# Patient Record
Sex: Female | Born: 1990 | Race: Black or African American | Hispanic: No | Marital: Single | Smoking: Never smoker
Health system: Southern US, Community
[De-identification: ages and names within clinical notes are randomized; demographics above are authoritative.]

## PROBLEM LIST (undated history)

## (undated) DIAGNOSIS — D6800 Von Willebrand disease, unspecified: Secondary | ICD-10-CM

## (undated) DIAGNOSIS — R569 Unspecified convulsions: Secondary | ICD-10-CM

## (undated) HISTORY — PX: BREAST BIOPSY: SHX20

## (undated) HISTORY — PX: LEEP: SHX91

---
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
## 2011-02-20 ENCOUNTER — Inpatient Hospital Stay (INDEPENDENT_AMBULATORY_CARE_PROVIDER_SITE_OTHER)
Admission: RE | Admit: 2011-02-20 | Discharge: 2011-02-20 | Disposition: A | Discharge status: Home / Self Care | Payer: Medicaid Other | Source: Ambulatory Visit | Attending: Family Medicine | Admitting: Family Medicine

## 2011-02-20 DIAGNOSIS — J020 Streptococcal pharyngitis: Secondary | ICD-10-CM

## 2012-03-02 ENCOUNTER — Inpatient Hospital Stay: Payer: Self-pay

## 2012-03-02 LAB — URIC ACID: Uric Acid: 5.1 mg/dL (ref 2.6–6.0)

## 2012-03-02 LAB — BASIC METABOLIC PANEL
Anion Gap: 12 (ref 7–16)
BUN: 11 mg/dL (ref 7–18)
Chloride: 107 mmol/L (ref 98–107)
Co2: 22 mmol/L (ref 21–32)
Creatinine: 0.72 mg/dL (ref 0.60–1.30)
EGFR (African American): >60
EGFR (Non-African Amer.): >60
Glucose: 71 mg/dL (ref 65–99)
Osmolality: 279 (ref 275–301)
Potassium: 4.3 mmol/L (ref 3.5–5.1)
Sodium: 141 mmol/L (ref 136–145)

## 2012-03-02 LAB — DRUG SCREEN, URINE
Barbiturates, Ur Screen: NEGATIVE (ref ?–200)
Benzodiazepine, Ur Scrn: NEGATIVE (ref ?–200)
Methadone, Ur Screen: NEGATIVE (ref ?–300)
Tricyclic, Ur Screen: NEGATIVE (ref ?–1000)

## 2012-03-02 LAB — CBC
HCT: 28.1 % — ABNORMAL LOW (ref 35.0–47.0)
MCHC: 33.2 g/dL (ref 32.0–36.0)
Platelet: 235 10*3/uL (ref 150–440)
RBC: 3.22 10*6/uL — ABNORMAL LOW (ref 3.80–5.20)
RDW: 15.1 % — ABNORMAL HIGH (ref 11.5–14.5)
WBC: 15.4 10*3/uL — ABNORMAL HIGH (ref 3.6–11.0)

## 2012-03-02 LAB — PROTEIN / CREATININE RATIO, URINE: Protein, Random Urine: 14 mg/dL — ABNORMAL HIGH (ref 0–12)

## 2012-03-03 LAB — PATHOLOGY REPORT

## 2013-10-17 ENCOUNTER — Emergency Department: Payer: Self-pay | Admitting: Emergency Medicine

## 2013-10-17 LAB — RAPID INFLUENZA A&B ANTIGENS

## 2015-03-13 NOTE — H&P (Signed)
L&D Evaluation:  History:   HPI (Late entry after delivery)24 year old G1 P0 with no prenatal care and LMP in September presented to L&D fully dilated and the fetal head on the perineum. She reports ctxs starting around 0200 this Am and LOF around 0330.    Presents with abdominal pain, contractions    Patient's Medical History Von Willebrand's disease, Bipolar disorder, Migraines, fx left elbow    Patient's Surgical History Excision breast cyst in 10th grade and laparoscopy in 8th grade    Medications Lamictal 150 mgm daily at 0700 and Ritalin 10 mgm at 0700 and 1000 daily    Allergies NKDA    Family History Non-Contributory   ROS:   ROS see HPI   Exam:   Vital Signs initially BPs 146/96, and 153/73 with contraction pain,    Urine Protein negative dipstick, on cath urine postpartum    General in pain on arrival    Mental Status clear    Chest clear    Heart normal sinus rhythm, no murmur/gallop/rubs    Abdomen gravid, tender with contractions    Estimated Fetal Weight Average for gestational age, On Leopolds-EFW 6#    Fetal Position vtx    Edema trace pedal edema    Reflexes 2+  postpartum    Pelvic C/C/+3 on arrival    Mebranes Ruptured    Description ?MSAF in fetal hair    FHT 130s    Ucx regular, about q2-3    Skin dry   Impression:   Impression Start of second stage. No prenatal care. Hx of Von Willebrand's   Plan:   Plan IV access, Pitocin and cytotec ready for use after placenta delivered. Called Dr Patton SallesWeaver Lee for assistance in case of hemorrhage. Prenatal labs including PIH panel   Electronic Signatures: Trinna BalloonGutierrez, Delisia Mcquiston L (CNM)  (Signed 30-Apr-13 07:37)  Authored: L&D Evaluation   Last Updated: 30-Apr-13 07:37 by Trinna BalloonGutierrez, Tylisa Alcivar L (CNM)

## 2019-03-21 ENCOUNTER — Other Ambulatory Visit: Payer: Self-pay

## 2019-03-21 ENCOUNTER — Encounter: Payer: Self-pay | Admitting: Emergency Medicine

## 2019-03-21 ENCOUNTER — Emergency Department
Admission: EM | Admit: 2019-03-21 | Discharge: 2019-03-22 | Disposition: A | Discharge status: Other Acute Inpatient Hospital | Payer: 59 | Attending: Emergency Medicine | Admitting: Emergency Medicine

## 2019-03-21 DIAGNOSIS — R251 Tremor, unspecified: Secondary | ICD-10-CM | POA: Insufficient documentation

## 2019-03-21 DIAGNOSIS — Z1159 Encounter for screening for other viral diseases: Secondary | ICD-10-CM | POA: Diagnosis not present

## 2019-03-21 DIAGNOSIS — R569 Unspecified convulsions: Secondary | ICD-10-CM

## 2019-03-21 DIAGNOSIS — F339 Major depressive disorder, recurrent, unspecified: Secondary | ICD-10-CM

## 2019-03-21 DIAGNOSIS — F329 Major depressive disorder, single episode, unspecified: Secondary | ICD-10-CM | POA: Diagnosis not present

## 2019-03-21 DIAGNOSIS — Z046 Encounter for general psychiatric examination, requested by authority: Secondary | ICD-10-CM | POA: Insufficient documentation

## 2019-03-21 DIAGNOSIS — F419 Anxiety disorder, unspecified: Secondary | ICD-10-CM | POA: Insufficient documentation

## 2019-03-21 DIAGNOSIS — F445 Conversion disorder with seizures or convulsions: Secondary | ICD-10-CM | POA: Diagnosis present

## 2019-03-21 DIAGNOSIS — F32A Depression, unspecified: Secondary | ICD-10-CM

## 2019-03-21 DIAGNOSIS — F332 Major depressive disorder, recurrent severe without psychotic features: Secondary | ICD-10-CM | POA: Diagnosis present

## 2019-03-21 HISTORY — DX: Von Willebrand disease, unspecified: D68.00

## 2019-03-21 HISTORY — DX: Unspecified convulsions: R56.9

## 2019-03-21 HISTORY — DX: Von Willebrand's disease: D68.0

## 2019-03-21 LAB — URINE DRUG SCREEN, QUALITATIVE (ARMC ONLY)
Amphetamines, Ur Screen: NOT DETECTED
Barbiturates, Ur Screen: NOT DETECTED
Benzodiazepine, Ur Scrn: NOT DETECTED
Cannabinoid 50 Ng, Ur ~~LOC~~: NOT DETECTED
Cocaine Metabolite,Ur ~~LOC~~: NOT DETECTED
MDMA (Ecstasy)Ur Screen: NOT DETECTED
Methadone Scn, Ur: NOT DETECTED
Opiate, Ur Screen: NOT DETECTED
Phencyclidine (PCP) Ur S: NOT DETECTED
Tricyclic, Ur Screen: NOT DETECTED

## 2019-03-21 LAB — CBC WITH DIFFERENTIAL/PLATELET
Abs Immature Granulocytes: 0.02 10*3/uL (ref 0.00–0.07)
Basophils Absolute: 0 10*3/uL (ref 0.0–0.1)
Basophils Relative: 0 %
Eosinophils Absolute: 0.2 10*3/uL (ref 0.0–0.5)
Eosinophils Relative: 3 %
HCT: 36 % (ref 36.0–46.0)
Hemoglobin: 12.3 g/dL (ref 12.0–15.0)
Immature Granulocytes: 0 %
Lymphocytes Relative: 23 %
Lymphs Abs: 1.7 10*3/uL (ref 0.7–4.0)
MCH: 28.1 pg (ref 26.0–34.0)
MCHC: 34.2 g/dL (ref 30.0–36.0)
MCV: 82.2 fL (ref 80.0–100.0)
Monocytes Absolute: 0.5 10*3/uL (ref 0.1–1.0)
Monocytes Relative: 6 %
Neutro Abs: 4.8 10*3/uL (ref 1.7–7.7)
Neutrophils Relative %: 68 %
Platelets: 297 10*3/uL (ref 150–400)
RBC: 4.38 MIL/uL (ref 3.87–5.11)
RDW: 13.8 % (ref 11.5–15.5)
WBC: 7.2 10*3/uL (ref 4.0–10.5)
nRBC: 0 % (ref 0.0–0.2)

## 2019-03-21 LAB — URINALYSIS, COMPLETE (UACMP) WITH MICROSCOPIC
Bacteria, UA: NONE SEEN
Bilirubin Urine: NEGATIVE
Glucose, UA: NEGATIVE mg/dL
Hgb urine dipstick: NEGATIVE
Ketones, ur: 20 mg/dL — AB
Nitrite: NEGATIVE
Protein, ur: NEGATIVE mg/dL
Specific Gravity, Urine: 1.014 (ref 1.005–1.030)
pH: 5 (ref 5.0–8.0)

## 2019-03-21 LAB — COMPREHENSIVE METABOLIC PANEL
ALT: 32 U/L (ref 0–44)
AST: 30 U/L (ref 15–41)
Albumin: 4.4 g/dL (ref 3.5–5.0)
Alkaline Phosphatase: 78 U/L (ref 38–126)
Anion gap: 8 (ref 5–15)
BUN: 12 mg/dL (ref 6–20)
CO2: 24 mmol/L (ref 22–32)
Calcium: 8.9 mg/dL (ref 8.9–10.3)
Chloride: 105 mmol/L (ref 98–111)
Creatinine, Ser: 0.63 mg/dL (ref 0.44–1.00)
GFR calc Af Amer: >60 mL/min (ref >60)
GFR calc non Af Amer: >60 mL/min (ref >60)
Glucose, Bld: 93 mg/dL (ref 70–99)
Potassium: 3.9 mmol/L (ref 3.5–5.1)
Sodium: 137 mmol/L (ref 135–145)
Total Bilirubin: 0.6 mg/dL (ref 0.3–1.2)
Total Protein: 8.1 g/dL (ref 6.5–8.1)

## 2019-03-21 LAB — SALICYLATE LEVEL: Salicylate Lvl: <7 mg/dL (ref 2.8–30.0)

## 2019-03-21 LAB — ACETAMINOPHEN LEVEL: Acetaminophen (Tylenol), Serum: <10 ug/mL — ABNORMAL LOW (ref 10–30)

## 2019-03-21 LAB — ETHANOL: Alcohol, Ethyl (B): <10 mg/dL (ref <10)

## 2019-03-21 LAB — POCT PREGNANCY, URINE: Preg Test, Ur: NEGATIVE

## 2019-03-21 LAB — PREGNANCY, URINE: Preg Test, Ur: NEGATIVE

## 2019-03-21 NOTE — ED Notes (Signed)
VOL/ Consult will be ordered

## 2019-03-21 NOTE — ED Notes (Signed)
Hourly rounding reveals patient in room. No complaints, stable, in no acute distress. Q15 minute rounds and monitoring via Rover and Officer to continue.   
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
## 2019-03-21 NOTE — ED Notes (Signed)
Pt. Transferred from Triage to room 23 after dressing out and screening for contraband. Pt. Oriented to Quad including Q15 minute rounds as well as Rover and Officer for their protection. Patient is alert and oriented, warm and dry in no acute distress. Patient denies SI, HI, and AVH. Pt. Encouraged to let me know if needs arise.  

## 2019-03-21 NOTE — ED Notes (Signed)
Hourly rounding reveals patient sleeping in room. No complaints, stable, in no acute distress. Q15 minute rounds and monitoring via Rover and Officer to continue.  

## 2019-03-21 NOTE — ED Triage Notes (Signed)
Pt presents to ED with mom wanting a mental health evaluation. Has been feeling like her mom is being mean to her and they have been arguing. Seen by [facility] today. denies SI  Or HI. Pt has possible seizure disorder but mom reports "they may not be real". On medication for the same but it doesn't seem to be helping. Recently moved to this area from [place].

## 2019-03-22 ENCOUNTER — Inpatient Hospital Stay
Admission: AD | Admit: 2019-03-22 | Discharge: 2019-03-25 | DRG: 882 | Disposition: A | Discharge status: Home / Self Care | Payer: 59 | Source: Intra-hospital | Attending: Psychiatry | Admitting: Psychiatry

## 2019-03-22 ENCOUNTER — Encounter: Payer: Self-pay | Admitting: Psychiatry

## 2019-03-22 ENCOUNTER — Other Ambulatory Visit: Payer: Self-pay

## 2019-03-22 ENCOUNTER — Encounter: Payer: Self-pay | Admitting: *Deleted

## 2019-03-22 DIAGNOSIS — F332 Major depressive disorder, recurrent severe without psychotic features: Secondary | ICD-10-CM | POA: Diagnosis not present

## 2019-03-22 DIAGNOSIS — F32A Depression, unspecified: Secondary | ICD-10-CM | POA: Insufficient documentation

## 2019-03-22 DIAGNOSIS — R45851 Suicidal ideations: Secondary | ICD-10-CM | POA: Diagnosis present

## 2019-03-22 DIAGNOSIS — Z1159 Encounter for screening for other viral diseases: Secondary | ICD-10-CM | POA: Diagnosis not present

## 2019-03-22 DIAGNOSIS — F909 Attention-deficit hyperactivity disorder, unspecified type: Secondary | ICD-10-CM | POA: Diagnosis present

## 2019-03-22 DIAGNOSIS — F4310 Post-traumatic stress disorder, unspecified: Secondary | ICD-10-CM

## 2019-03-22 DIAGNOSIS — F445 Conversion disorder with seizures or convulsions: Secondary | ICD-10-CM | POA: Diagnosis present

## 2019-03-22 DIAGNOSIS — R569 Unspecified convulsions: Secondary | ICD-10-CM | POA: Diagnosis present

## 2019-03-22 DIAGNOSIS — F411 Generalized anxiety disorder: Secondary | ICD-10-CM | POA: Diagnosis present

## 2019-03-22 DIAGNOSIS — F329 Major depressive disorder, single episode, unspecified: Secondary | ICD-10-CM | POA: Insufficient documentation

## 2019-03-22 DIAGNOSIS — F339 Major depressive disorder, recurrent, unspecified: Secondary | ICD-10-CM

## 2019-03-22 LAB — SARS CORONAVIRUS 2 BY RT PCR (HOSPITAL ORDER, PERFORMED IN ~~LOC~~ HOSPITAL LAB): SARS Coronavirus 2: NEGATIVE

## 2019-03-22 MED ORDER — MAGNESIUM HYDROXIDE 400 MG/5ML PO SUSP: 30.0000 mL | Freq: Every day | ORAL | Status: DC | PRN

## 2019-03-22 MED ORDER — FLUOXETINE HCL 20 MG PO CAPS
20.0000 mg | ORAL_CAPSULE | Freq: Every day | ORAL | Status: DC
  Administered 2019-03-23 – 2019-03-25 (×3): 20 mg via ORAL
  Filled 2019-03-22 (×3): qty 1

## 2019-03-22 MED ORDER — FLUOXETINE HCL 20 MG PO CAPS
20.0000 mg | ORAL_CAPSULE | Freq: Every day | ORAL | Status: DC
  Filled 2019-03-22: qty 1

## 2019-03-22 MED ORDER — LORAZEPAM 1 MG PO TABS: 1.0000 mg | ORAL_TABLET | ORAL | Status: DC | PRN

## 2019-03-22 MED ORDER — ACETAMINOPHEN 325 MG PO TABS: 650.0000 mg | ORAL_TABLET | Freq: Four times a day (QID) | ORAL | Status: DC | PRN

## 2019-03-22 MED ORDER — DIPHENHYDRAMINE HCL 25 MG PO CAPS: 50.0000 mg | ORAL_CAPSULE | Freq: Every evening | ORAL | Status: DC | PRN

## 2019-03-22 MED ORDER — RISPERIDONE 1 MG PO TBDP
2.0000 mg | ORAL_TABLET | Freq: Three times a day (TID) | ORAL | Status: DC | PRN
  Filled 2019-03-22: qty 2

## 2019-03-22 MED ORDER — LORAZEPAM 0.5 MG PO TABS: 0.5000 mg | ORAL_TABLET | ORAL | Status: DC

## 2019-03-22 MED ORDER — HYDROXYZINE HCL 25 MG PO TABS: 25.0000 mg | ORAL_TABLET | Freq: Three times a day (TID) | ORAL | Status: DC | PRN

## 2019-03-22 MED ORDER — NON FORMULARY: 1000.0000 mg | Freq: Two times a day (BID) | Status: DC

## 2019-03-22 MED ORDER — ALUM & MAG HYDROXIDE-SIMETH 200-200-20 MG/5ML PO SUSP: 30.0000 mL | ORAL | Status: DC | PRN

## 2019-03-22 MED ORDER — LEVETIRACETAM 500 MG PO TABS: 1000.0000 mg | ORAL_TABLET | Freq: Two times a day (BID) | ORAL | Status: DC

## 2019-03-22 MED ORDER — LEVETIRACETAM 500 MG PO TABS
1000.0000 mg | ORAL_TABLET | Freq: Two times a day (BID) | ORAL | Status: DC
  Administered 2019-03-22 – 2019-03-25 (×6): 1000 mg via ORAL
  Filled 2019-03-22 (×6): qty 2

## 2019-03-22 MED ORDER — ZIPRASIDONE MESYLATE 20 MG IM SOLR: 20.0000 mg | INTRAMUSCULAR | Status: DC | PRN

## 2019-03-22 NOTE — ED Notes (Signed)
BEHAVIORAL HEALTH ROUNDING Patient sleeping: No. Patient alert and oriented: yes Behavior appropriate: Yes.  ; If no, describe:  Nutrition and fluids offered: yes Toileting and hygiene offered: Yes  Sitter present: q15 minute observations and security  monitoring Law enforcement present: Yes  ODS  

## 2019-03-22 NOTE — ED Notes (Signed)
ED  Is the patient under IVC or is there intent for IVC: Yes.   Is the patient medically cleared: Yes.   Is there vacancy in the ED BHU: Yes.   Unit closed   Is the population mix appropriate for patient: Yes.   Is the patient awaiting placement in inpatient or outpatient setting:  Has the patient had a psychiatric consult: Yes.  Seen by NP - pt awaiting reassessment and colateral info  Survey of unit performed for contraband, proper placement and condition of furniture, tampering with fixtures in bathroom, shower, and each patient room: Yes.  ; Findings:  APPEARANCE/BEHAVIOR Calm and cooperative NEURO ASSESSMENT Orientation: oriented x3  Denies pain Hallucinations: No.None noted (Hallucinations) denies  Speech: Normal Gait: normal RESPIRATORY ASSESSMENT Even  Unlabored respirations  CARDIOVASCULAR ASSESSMENT Pulses equal   regular rate  Skin warm and dry   GASTROINTESTINAL ASSESSMENT no GI complaint EXTREMITIES Full ROM  PLAN OF CARE Provide calm/safe environment. Vital signs assessed twice daily. ED BHU Assessment once each 12-hour shift. Collaborate with TTS if available or as condition indicates. Assure the ED provider has rounded once each shift. Provide and encourage hygiene. Provide redirection as needed. Assess for escalating behavior; address immediately and inform ED provider.  Assess family dynamic and appropriateness for visitation as needed: Yes.  ; If necessary, describe findings:  Educate the patient/family about BHU procedures/visitation: Yes.  ; If necessary, describe findings:

## 2019-03-22 NOTE — ED Notes (Signed)

## 2019-03-22 NOTE — Tx Team (Signed)
Initial Treatment Plan 03/22/2019 4:13 PM Celica E Trigo VEL:[id number]    PATIENT STRESSORS: Financial difficulties Marital or family conflict Occupational concerns   PATIENT STRENGTHS: Average or above average intelligence Communication skills Physical Health   PATIENT IDENTIFIED PROBLEMS: Family conflict  Depression  Anxiety  Ineffective coping skills  "I need to work on my anger."             DISCHARGE CRITERIA:  Improved stabilization in mood, thinking, and/or behavior Need for constant or close observation no longer present Verbal commitment to aftercare and medication compliance  PRELIMINARY DISCHARGE PLAN: Outpatient therapy Return to previous living arrangement  PATIENT/FAMILY INVOLVEMENT: This treatment plan has been presented to and reviewed with the patient, Priyana E Courtney.  The patient and family have been given the opportunity to ask questions and make suggestions.  Cranford Mon, RN 03/22/2019, 4:13 PM

## 2019-03-22 NOTE — ED Notes (Signed)
Hourly rounding reveals patient sleeping in room. No complaints, stable, in no acute distress. Q15 minute rounds and monitoring via Rover and Officer to continue.  

## 2019-03-22 NOTE — ED Notes (Signed)
Pt. C/o "having a seizure. Presents with hands shaking with ability to verbally discuss symptoms. Dr. Fanny Bien notified.

## 2019-03-22 NOTE — ED Notes (Signed)
Gave food tray with juice. 

## 2019-03-22 NOTE — ED Notes (Signed)
Pt observed lying in bed - watching TV   Pt visualized with NAD  No verbalized needs or concerns at this time   Assessment completed  Plan of care discussed   Continue to monitor
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
## 2019-03-22 NOTE — BH Assessment (Signed)
Patient is to be admitted to [facility]U by Dr. Viviano Simas, pending COIVD-19  Attending Physician will be Dr. Toni Amend.   Patient has been assigned to room 302 by [facility] Charge Nurse [place].   Intake Paper Work has been signed and placed on patient chart.  ER staff is aware of the admission:  Misty Stanley, ER Secretary    Dr. Scotty Court, ER MD   Amy T., Patient's Nurse   Donella Stade., Patient Access.
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
## 2019-03-22 NOTE — ED Provider Notes (Addendum)
-----------------------------------------   1:46 AM on 03/22/2019 -----------------------------------------   Blood pressure 111/72, pulse (!) 107, temperature 98.5 F (36.9 C), temperature source Oral, resp. rate 18, height 5\' 7"  (1.702 m), weight 77.1 kg, SpO2 96 %.  The patient is calm and cooperative at this time.  There have been no acute events since the last update.  Awaiting disposition plan from Behavioral Medicine team.    Sharyn Creamer, MD 03/22/19 0146   ----------------------------------------- 7:04 AM on 03/22/2019 -----------------------------------------  Patient very anxious, she reports she starts to shake she gets very anxious.  She is fully conversant and alert.  She reports that she is just really nervous about everything is been going on her life.  If she gets symptoms like this when she is nervous, will give a small dose of oral Ativan as she appears very anxious.  She is tremulous, but definitely not having seizure-like activity.  She reports same in the past multiple times      Sharyn Creamer, MD 03/22/19 [phone]
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
## 2019-03-22 NOTE — ED Notes (Signed)
VOL/ Consult completed by NP request re-evaluation in Am

## 2019-03-23 DIAGNOSIS — F4310 Post-traumatic stress disorder, unspecified: Principal | ICD-10-CM

## 2019-03-23 DIAGNOSIS — F909 Attention-deficit hyperactivity disorder, unspecified type: Secondary | ICD-10-CM

## 2019-03-23 LAB — LEVETIRACETAM LEVEL: Levetiracetam Lvl: 15.4 ug/mL (ref 10.0–40.0)

## 2019-03-23 MED ORDER — PRAZOSIN HCL 1 MG PO CAPS
1.0000 mg | ORAL_CAPSULE | Freq: Every day | ORAL | Status: DC
  Administered 2019-03-23 – 2019-03-24 (×2): 1 mg via ORAL
  Filled 2019-03-23 (×2): qty 1

## 2019-03-23 MED ORDER — METHYLPHENIDATE HCL ER (OSM) 18 MG PO TBCR: 18.0000 mg | EXTENDED_RELEASE_TABLET | Freq: Every day | ORAL | Status: DC

## 2019-03-23 MED ORDER — AMPHETAMINE-DEXTROAMPHETAMINE 5 MG PO TABS
10.0000 mg | ORAL_TABLET | Freq: Two times a day (BID) | ORAL | Status: DC
  Administered 2019-03-23 – 2019-03-25 (×4): 10 mg via ORAL
  Filled 2019-03-23: qty 2
  Filled 2019-03-23: qty 1
  Filled 2019-03-23 (×3): qty 2

## 2019-03-23 NOTE — [facility] Suicide Risk Assessment (Signed)
[facility]   Nursing information obtained from:  Patient Demographic factors:  Low socioeconomic status, Unemployed Current Mental Status:  NA Loss Factors:  Financial problems / change in socioeconomic status Historical Factors:  NA Risk Reduction Factors:  Sense of responsibility to family  Total Time spent with patient: 1 hour Principal Problem: <principal problem not specified> Diagnosis:  Active Problems:   MDD (major depressive disorder), recurrent severe, without psychosis (HCC)  Subjective Data: Patient seen and chart reviewed.  See intake note.  Young woman with a history of mood instability and impulsivity has been feeling overwhelmed and stressed by living with her mother.  Reports passive suicidal thoughts but no intention or plan of acting on them.  Does not report any psychotic symptoms does not report any violent thoughts.  She is open and appropriately interested in receiving treatment.  Not abusing substances.  Continued Clinical Symptoms:  Alcohol Use Disorder Identification Test Final Score (AUDIT): 0 The "Alcohol Use Disorders Identification Test", Guidelines for Use in Primary Care, Second Edition.  World Science writerHealth Organization [facility](WHO). Score between 0-7:  no or low risk or alcohol related problems. Score between 8-15:  moderate risk of alcohol related problems. Score between 16-19:  high risk of alcohol related problems. Score 20 or above:  warrants further diagnostic evaluation for alcohol dependence and treatment.   CLINICAL FACTORS:   Depression:   Impulsivity   Musculoskeletal: Strength & Muscle Tone: within normal limits Gait & Station: normal Patient leans: N/A  Psychiatric Specialty Exam: Physical Exam  Nursing note and vitals reviewed. Constitutional: She appears well-developed and well-nourished.  HENT:  Head: Normocephalic and atraumatic.  Eyes: Pupils are equal, round, and reactive to light. Conjunctivae are normal.   Neck: Normal range of motion.  Cardiovascular: Regular rhythm and normal heart sounds.  Respiratory: Effort normal. No respiratory distress.  GI: Soft.  Musculoskeletal: Normal range of motion.  Neurological: She is alert.  Skin: Skin is warm and dry.  Psychiatric: She has a normal mood and affect. Her speech is normal and behavior is normal. Judgment and thought content normal. Cognition and memory are normal.    Review of Systems  Constitutional: Negative.   HENT: Negative.   Eyes: Negative.   Respiratory: Negative.   Cardiovascular: Negative.   Gastrointestinal: Negative.   Musculoskeletal: Negative.   Skin: Negative.   Neurological: Negative.   Psychiatric/Behavioral: Positive for suicidal ideas. The patient is nervous/anxious.     Blood pressure 104/61, pulse 87, temperature 97.6 F (36.4 C), temperature source Oral, resp. rate 18, height 5\' 7"  (1.702 m), weight 77.1 kg, SpO2 100 %.Body mass index is 26.62 kg/m.  General Appearance: Casual  Eye Contact:  Good  Speech:  Clear and Coherent  Volume:  Normal  Mood:  Anxious  Affect:  Appropriate  Thought Process:  Goal Directed  Orientation:  Full (Time, Place, and Person)  Thought Content:  Logical  Suicidal Thoughts:  No  Homicidal Thoughts:  No  Memory:  Immediate;   Fair Recent;   Fair Remote;   Fair  Judgement:  Fair  Insight:  Fair  Psychomotor Activity:  Normal  Concentration:  Concentration: Fair  Recall:  [employer]  Fund of Knowledge:  Fair  Language:  Fair  Akathisia:  No  Handed:  Right  AIMS (if indicated):     Assets:  Desire for Improvement Housing Physical Health Resilience  ADL's:  Intact  Cognition:  WNL  Sleep:  Number of Hours: 5.15  COGNITIVE FEATURES THAT CONTRIBUTE TO RISK:  Polarized thinking    SUICIDE RISK:   Minimal: No identifiable suicidal ideation.  Patients presenting with no risk factors but with morbid ruminations; may be classified as minimal risk based on the severity  of the depressive symptoms  PLAN OF CARE: Patient admitted to the psychiatric ward.  15-minute checks in place.  Spoke with patient about making some changes to medication to try and address her symptoms.  Include patient in individual and group therapy.  Make sure she has appropriate outpatient treatment when she leaves.  Ongoing reassessment of suicidality.  I certify that inpatient services furnished can reasonably be expected to improve the patient's condition.   Mordecai Rasmussen, MD 03/23/2019, 1:34 PM
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
## 2019-03-23 NOTE — [facility] Group Notes (Signed)
LCSW Group Therapy Note  03/23/2019 1:00 PM  Type of Therapy/Topic:  Group Therapy:  Emotion Regulation  Participation Level:  Did Not Attend   Description of Group:   The purpose of this group is to assist patients in learning to regulate negative emotions and experience positive emotions. Patients will be guided to discuss ways in which they have been vulnerable to their negative emotions. These vulnerabilities will be juxtaposed with experiences of positive emotions or situations, and patients will be challenged to use positive emotions to combat negative ones. Special emphasis will be placed on coping with negative emotions in conflict situations, and patients will process healthy conflict resolution skills.  Therapeutic Goals: 1. Patient will identify two positive emotions or experiences to reflect on in order to balance out negative emotions 2. Patient will label two or more emotions that they find the most difficult to experience 3. Patient will demonstrate positive conflict resolution skills through discussion and/or role plays  Summary of Patient Progress: X  Therapeutic Modalities:   Cognitive Behavioral Therapy Feelings Identification Dialectical Behavioral Therapy  Penni Homans, MSW, LCSW 03/23/2019 12:40 PM
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
## 2019-03-23 NOTE — Tx Team (Addendum)
Interdisciplinary Treatment and Diagnostic Plan Update  03/23/2019 Time of Session: 2:30pm Sandra Chavez MRN: [id number]  Principal Diagnosis: PTSD (post-traumatic stress disorder)  Secondary Diagnoses: Principal Problem:   PTSD (post-traumatic stress disorder) Active Problems:   Seizures (HCC)   Attention deficit hyperactivity disorder (ADHD)   Current Medications:  Current Facility-Administered Medications  Medication Dose Route Frequency Provider Last Rate Last Dose  . acetaminophen (TYLENOL) tablet 650 mg  650 mg Oral Q6H PRN Mariel Craft, MD      . alum & mag hydroxide-simeth (MAALOX/MYLANTA) 200-200-20 MG/5ML suspension 30 mL  30 mL Oral Q4H PRN Mariel Craft, MD      . diphenhydrAMINE (BENADRYL) capsule 50 mg  50 mg Oral QHS PRN Mariel Craft, MD      . FLUoxetine (PROZAC) capsule 20 mg  20 mg Oral Daily Mariel Craft, MD   20 mg at 03/23/19 0754  . hydrOXYzine (ATARAX/VISTARIL) tablet 25 mg  25 mg Oral TID PRN Mariel Craft, MD      . levETIRAcetam (KEPPRA) tablet 1,000 mg  1,000 mg Oral BID Clapacs, Jackquline Denmark, MD   1,000 mg at 03/23/19 0907  . magnesium hydroxide (MILK OF MAGNESIA) suspension 30 mL  30 mL Oral Daily PRN Mariel Craft, MD      . methylphenidate (CONCERTA) CR tablet 18 mg  18 mg Oral Daily Clapacs, John T, MD      . prazosin (MINIPRESS) capsule 1 mg  1 mg Oral QHS Clapacs, John T, MD       PTA Medications: Medications Prior to Admission  Medication Sig Dispense Refill Last Dose  . Biotin 1000 MCG tablet Take 1,000 mcg by mouth daily.   03/21/2019 at 0730  . Cholecalciferol (VITAMIN D3) 125 MCG (5000 UT) TABS Take 5,000 Units by mouth daily.   03/21/2019 at 0730  . ferrous sulfate 325 (65 FE) MG tablet Take 325 mg by mouth daily.   03/21/2019 at 0730  . folic acid (FOLVITE) 800 MCG tablet Take 400 mcg by mouth daily.   03/21/2019 at 0730  . levETIRAcetam (KEPPRA) 500 MG tablet Take 1,000 mg by mouth 2 (two) times daily.   03/21/2019 at 0730  .  Pyridoxine HCl (VITAMIN B-6) 250 MG tablet Take 250 mg by mouth daily.   03/21/2019 at 0730    Patient Stressors: Financial difficulties Marital or family conflict Occupational concerns  Patient Strengths: Average or above average intelligence Communication skills Physical Health  Treatment Modalities: Medication Management, Group therapy, Case management,  1 to 1 session with clinician, Psychoeducation, Recreational therapy.   Physician Treatment Plan for Primary Diagnosis: PTSD (post-traumatic stress disorder) Long Term Goal(s): Improvement in symptoms so as ready for discharge Improvement in symptoms so as ready for discharge   Short Term Goals: Ability to verbalize feelings will improve Ability to demonstrate self-control will improve Ability to maintain clinical measurements within normal limits will improve Compliance with prescribed medications will improve  Medication Management: Evaluate patient's response, side effects, and tolerance of medication regimen.  Therapeutic Interventions: 1 to 1 sessions, Unit Group sessions and Medication administration.  Evaluation of Outcomes: Progressing  Physician Treatment Plan for Secondary Diagnosis: Principal Problem:   PTSD (post-traumatic stress disorder) Active Problems:   Seizures (HCC)   Attention deficit hyperactivity disorder (ADHD)  Long Term Goal(s): Improvement in symptoms so as ready for discharge Improvement in symptoms so as ready for discharge   Short Term Goals: Ability to verbalize feelings will improve Ability to  demonstrate self-control will improve Ability to maintain clinical measurements within normal limits will improve Compliance with prescribed medications will improve     Medication Management: Evaluate patient's response, side effects, and tolerance of medication regimen.  Therapeutic Interventions: 1 to 1 sessions, Unit Group sessions and Medication administration.  Evaluation of Outcomes:  Progressing   RN Treatment Plan for Primary Diagnosis: PTSD (post-traumatic stress disorder) Long Term Goal(s): Knowledge of disease and therapeutic regimen to maintain health will improve  Short Term Goals: Ability to demonstrate self-control, Ability to verbalize feelings will improve, Ability to disclose and discuss suicidal ideas and Ability to identify and develop effective coping behaviors will improve  Medication Management: RN will administer medications as ordered by provider, will assess and evaluate patient's response and provide education to patient for prescribed medication. RN will report any adverse and/or side effects to prescribing provider.  Therapeutic Interventions: 1 on 1 counseling sessions, Psychoeducation, Medication administration, Evaluate responses to treatment, Monitor vital signs and CBGs as ordered, Perform/monitor CIWA, COWS, AIMS and Fall Risk screenings as ordered, Perform wound care treatments as ordered.  Evaluation of Outcomes: Progressing   LCSW Treatment Plan for Primary Diagnosis: PTSD (post-traumatic stress disorder) Long Term Goal(s): Safe transition to appropriate next level of care at discharge, Engage patient in therapeutic group addressing interpersonal concerns.  Short Term Goals: Engage patient in aftercare planning with referrals and resources, Increase social support, Increase ability to appropriately verbalize feelings, Increase emotional regulation and Facilitate acceptance of mental health diagnosis and concerns  Therapeutic Interventions: Assess for all discharge needs, 1 to 1 time with Social worker, Explore available resources and support systems, Assess for adequacy in community support network, Educate family and significant other(s) on suicide prevention, Complete Psychosocial Assessment, Interpersonal group therapy.  Evaluation of Outcomes: Progressing   Progress in Treatment: Attending groups: No. Participating in groups:  No. Taking medication as prescribed: Yes. Toleration medication: Yes. Family/Significant other contact made: Yes, individual(s) contacted:  SPE completed with pt's mother. Patient understands diagnosis: Yes. Discussing patient identified problems/goals with staff: Yes. Medical problems stabilized or resolved: Yes. Denies suicidal/homicidal ideation: Yes. Issues/concerns per patient self-inventory: No. Other: none  New problem(s) identified: No, Describe:  none  New Short Term/Long Term Goal(s): medication management for mood stabilization; elimination of SI thoughts; development of comprehensive mental wellness plan.  Patient Goals:  "just want to have medication to keep me calm so I don't end up here again"  Discharge Plan or Barriers: Patient reports plans to discharge home to her mothers.  Mother agrees, however, pt will need to look for her own permanent housing.  Pt has an outpatient appointment set up.   Reason for Continuation of Hospitalization: Anxiety Depression Medication stabilization Suicidal ideation  Estimated Length of Stay: 1-5 days  Recreational Therapy: Patient Stressors: Family, Work  Patient Goal: Patient will identify 3 positive coping skills strategies to use for anxiety post d/c within 5 recreation therapy group sessions  Attendees: Patient: Sandra Chavez 03/23/2019 3:21 PM  Physician: Dr. Toni Amendlapacs, MD 03/23/2019 3:21 PM  Nursing: Hulan AmatoGwen Farrish, RN 03/23/2019 3:21 PM  RN Care Manager: 03/23/2019 3:21 PM  Social Worker: Penni HomansMichaela Stanfield, LCSW 03/23/2019 3:21 PM  Recreational Therapist: Garret ReddishShay Shantaya Bluestone, Drue FlirtRS, LRT 03/23/2019 3:21 PM  Other: Lowella Dandyarren Livingston, LCSW 03/23/2019 3:21 PM  Other:  03/23/2019 3:21 PM  Other: 03/23/2019 3:21 PM    Scribe for Treatment Team: Harden MoMichaela J Stanfield, LCSW 03/23/2019 3:21 PM

## 2019-03-23 NOTE — Progress Notes (Signed)
D: Patient stated slept fair last night .Stated appetite  good and energy level   normal. Stated concentration  good . Stated on Depression scale 6, hopeless 0 and anxiety 4 .( low 0-10 high) Denies suicidal  homicidal ideations  .  No auditory hallucinations  No pain concerns . Appropriate ADL'S. Interacting with peers and staff. Instructed on [facility] Education  and unit programing . Verbalizing understanding .Patient able to  attend unit programing . Able to verbalize   and understanding of information  received . Working on anxiety , coping and decision making skills .  Compliant wit medication , understanding of medication given . Thought process improving . Voice non concerns around  wake and sleep cycle.   Emotional and mental status  improving . No anger outburst  or control issues . Patient voice of no safety  concerns . Ey contact improving  during conversations . Patient able to say positive  attributes  about self . Patient voice  Of her goal today  Work on anxiety and coping skills    A: Encourage patient participation with unit programming . Instruction  Given on  Medication , verbalize understanding.  R: Voice no other concerns. Staff continue to monitor

## 2019-03-23 NOTE — Progress Notes (Signed)
Patient is calm and oriented, adjusting to  Unit guide lines and safety, appetite is good, interact with staff and behavior is appropriate , takes her medications with any issues patient denies any SI/HI/AVH and has no physical complains. sleep is continuous with out any interruptions , only requiring 15 minutes safety checks no distress.Marland Kitchen

## 2019-03-23 NOTE — BHH Counselor (Signed)
Mother reports that the patient was previously living in [place] before returning to the home May 9th. Mother reports that patient did not live with her in [place] "due to her child-like behaviors, she would throw tantrums and be irate. 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 Mother also reports that the patient "became upset that she made her son a meal and he didn't want it.  He knows that she is his mother but he doesn't see her as that and thinks of her more like a sister".  Mother reports that she has had guardianship of the pt's 29 year old "ever since he was born".  Mother reports "since she was mad with me she would not have a relationship with him."  Mother reports "she gets mad with [place] when we tell her what she is thinking is not true or wrong."  Mother reports "any time that I correct her she is screaming and mad".   Mother reports that "she began to act out so I called [facility].  She told the lady that she was suicidal so I had to bring her in to their office."  Mother reports that she began to have a seizure "as soon as she got in the car. But it's hard for me to conceive that she will go from having a seizure to being completely lucid."  Mother reports that her father took the patient to the hospital at the patient's insistence.  Mother reports that the patient was acting child-like at this time.  Mother reports that the patient "sang the entire way to the hospital".   Mother reports a history of Bipolar Disorder, medication and outpatient therapy.  Mother reports that the seizures appear to be directly to anxiety and mental health.   Penni Homans, MSW, LCSW 03/23/2019 11:21 AM

## 2019-03-23 NOTE — Progress Notes (Signed)
Recreation Therapy Notes  INPATIENT RECREATION THERAPY ASSESSMENT  Patient Details Name: Sandra Chavez MRN: [id number] DOB: 05/01/1991 Today's Date: 03/23/2019       Information Obtained From: Patient  Able to Participate in Assessment/Interview: Yes  Patient Presentation: Responsive, Alert, Oriented  Reason for Admission (Per Patient): Active Symptoms  Patient Stressors: Family, Work  Pharmacologist:   Music, Talk, Other (Comment)(video games)  Leisure Interests (2+):  Exercise - Walking, Individual - Reading, Games - Video games  Frequency of Recreation/Participation: Weekly  Awareness of Community Resources:  Yes  Community Resources:  Other (Comment)([facility])  Current Use:    If no, Barriers?:    Expressed Interest in [employer] Information:    [place] of Residence:  Film/video editor  Patient Main Form of Transportation: Other (Comment)(Family)  Patient Strengths:  Kind, compassionate, help others  Patient Identified Areas of Improvement:  Being more rational   Patient Goal for Hospitalization:  Try and manage my stress and anxiety better.  Current SI (including self-harm):  No  Current HI:  No  Current AVH: No  Staff Intervention Plan: Group Attendance, Collaborate with Interdisciplinary Treatment Team  Consent to Intern Participation: N/A  Sandra Chavez 03/23/2019, 11:09 AM

## 2019-03-23 NOTE — Plan of Care (Signed)
Instructed on [facility] Education  and unit programing . Verbalizing understanding .Patient able to  attend unit programing . Able to verbalize   and understanding of information  received . Working on anxiety , coping and decision making skills .  Compliant wit medication , understanding of medication given . Thought process improving . Voice non concerns around  wake and sleep cycle.   Emotional and mental status  improving . No anger outburst  or control issues . Patient voice of no safety  concerns . Ey contact improving  during conversations . Patient able to say positive  attributes  about self .  Problem: Education: Goal: Ability to state activities that reduce stress will improve Outcome: Progressing   Problem: Coping: Goal: Ability to identify and develop effective coping behavior will improve Outcome: Progressing   Problem: Self-Concept: Goal: Ability to identify factors that promote anxiety will improve Outcome: Progressing Goal: Level of anxiety will decrease Outcome: Progressing Goal: Ability to modify response to factors that promote anxiety will improve Outcome: Progressing   Problem: Education: Goal: Utilization of techniques to improve thought processes will improve Outcome: Progressing Goal: Knowledge of the prescribed therapeutic regimen will improve Outcome: Progressing   Problem: Activity: Goal: Interest or engagement in leisure activities will improve Outcome: Progressing Goal: Imbalance in normal sleep/wake cycle will improve Outcome: Progressing   Problem: Coping: Goal: Coping ability will improve Outcome: Progressing Goal: Will verbalize feelings Outcome: Progressing   Problem: Health Behavior/Discharge Planning: Goal: Ability to make decisions will improve Outcome: Progressing Goal: Compliance with therapeutic regimen will improve Outcome: Progressing   Problem: Education: Goal: Knowledge of [facility] Education information/materials  will improve Outcome: Progressing Goal: Emotional status will improve Outcome: Progressing Goal: Mental status will improve Outcome: Progressing Goal: Verbalization of understanding the information provided will improve Outcome: Progressing   Problem: Activity: Goal: Interest or engagement in activities will improve Outcome: Progressing Goal: Sleeping patterns will improve Outcome: Progressing   Problem: Coping: Goal: Ability to verbalize frustrations and anger appropriately will improve Outcome: Progressing Goal: Ability to demonstrate self-control will improve Outcome: Progressing   Problem: Health Behavior/Discharge Planning: Goal: Identification of resources available to assist in meeting health care needs will improve Outcome: Progressing Goal: Compliance with treatment plan for underlying cause of condition will improve Outcome: Progressing   Problem: Physical Regulation: Goal: Ability to maintain clinical measurements within normal limits will improve Outcome: Progressing   Problem: Safety: Goal: Periods of time without injury will increase Outcome: Progressing   Problem: Activity: Goal: Will identify at least one activity in which they can participate Outcome: Progressing   Problem: Coping: Goal: Ability to identify and develop effective coping behavior will improve Outcome: Progressing Goal: Ability to interact with others will improve Outcome: Progressing Goal: Demonstration of participation in decision-making regarding own care will improve Outcome: Progressing Goal: Ability to use eye contact when communicating with others will improve Outcome: Progressing   Problem: Health Behavior/Discharge Planning: Goal: Identification of resources available to assist in meeting health care needs will improve Outcome: Progressing   Problem: Self-Concept: Goal: Will verbalize positive feelings about self Outcome: Progressing

## 2019-03-23 NOTE — Progress Notes (Signed)
Recreation Therapy Notes   Date: 03/23/2019  Time: 9:30 am  Location: Craft room  Behavioral response: Appropriate   Intervention Topic: Stress  Discussion/Intervention:  Group content on today was focused on stress. The group defined stress and way to cope with stress. Participants expressed how they know when they are stresses out. Individuals described the different ways they have to cope with stress. The group stated reasons why it is important to cope with stress. Patient explained what good stress is and some examples. The group participated in the intervention "Stress Management Jeopardy". Individuals were separated into two group and answered questions related to stress.   Clinical Observations/Feedback:  Patient came to group and defined stress as anything that overwhelms you. She identified listening to music and playing video games as ways she copes with her stress. Participant explained that depending on the situation and who is around she may or may not ask for help when she is stress. Patient explained that her family is a major stressor for her right now. Individual was social with staff while participating in the intervention. Jarvis Knodel LRT/CTRS          Marjie Chea 03/23/2019 11:04 AM

## 2019-03-23 NOTE — [facility] Suicide Risk Assessment (Signed)
[facility] INPATIENT:  Family/Significant Other Suicide Prevention Education  Suicide Prevention Education:  Education Completed; Sandra Chavez, mother, [phone] has been identified by the patient as the family member/significant other with whom the patient will be residing, and identified as the person(s) who will aid the patient in the event of a mental health crisis (suicidal ideations/suicide attempt).  With written consent from the patient, the family member/significant other has been provided the following suicide prevention education, prior to the and/or following the discharge of the patient.  The suicide prevention education provided includes the following:  Suicide risk factors  Suicide prevention and interventions  National Suicide Hotline telephone number  [facility] assessment telephone number  [facility] Emergency Assistance 911  [facility] and/or Residential Mobile Crisis Unit telephone number  Request made of family/significant other to:  Remove weapons (e.g., guns, rifles, knives), all items previously/currently identified as safety concern.    Remove drugs/medications (over-the-counter, prescriptions, illicit drugs), all items previously/currently identified as a safety concern.  The family member/significant other verbalizes understanding of the suicide prevention education information provided.  The family member/significant other agrees to remove the items of safety concern listed above.  Harden Mo 03/23/2019, 10:56 AM

## 2019-03-24 MED ORDER — AMPHETAMINE-DEXTROAMPHETAMINE 5 MG PO TABS: 10.0000 mg | ORAL_TABLET | Freq: Two times a day (BID) | ORAL | 0 refills | Status: AC

## 2019-03-24 MED ORDER — PRAZOSIN HCL 1 MG PO CAPS: 1.0000 mg | ORAL_CAPSULE | Freq: Every day | ORAL | 1 refills | Status: DC

## 2019-03-24 MED ORDER — PRAZOSIN HCL 1 MG PO CAPS: 1.0000 mg | ORAL_CAPSULE | Freq: Every day | ORAL | 0 refills | Status: DC

## 2019-03-24 MED ORDER — LEVETIRACETAM 1000 MG PO TABS: 1000.0000 mg | ORAL_TABLET | Freq: Two times a day (BID) | ORAL | 0 refills | Status: DC

## 2019-03-24 MED ORDER — FLUOXETINE HCL 20 MG PO CAPS: 20.0000 mg | ORAL_CAPSULE | Freq: Every day | ORAL | 1 refills | Status: DC

## 2019-03-24 MED ORDER — FLUOXETINE HCL 20 MG PO CAPS: 20.0000 mg | ORAL_CAPSULE | Freq: Every day | ORAL | 0 refills | Status: DC

## 2019-03-24 MED ORDER — LEVETIRACETAM 1000 MG PO TABS: 1000.0000 mg | ORAL_TABLET | Freq: Two times a day (BID) | ORAL | 1 refills | Status: DC

## 2019-03-24 NOTE — Plan of Care (Signed)
D: Pt present and active on unit. Pt is interacting appropriately with peers on unit. Pt denies HI, SI, AVH and does not appear to be responding to internal stimuli. Pt is bright and eager to interact. Pt is eager to be discharged tomorrow and reports her stay has helped facilitate communication between her and her mother. Patient denies pain or other issues. No distress is noted. A: Encouragement is offered. Patient medications are offered per provider order. Patient monitored for safety per provider order.  R: Pt is safe on unit. Will continue to monitor.   Problem: Education: Goal: Ability to state activities that reduce stress will improve Outcome: Progressing   Problem: Coping: Goal: Ability to identify and develop effective coping behavior will improve Outcome: Progressing   Problem: Self-Concept: Goal: Ability to identify factors that promote anxiety will improve Outcome: Progressing Goal: Level of anxiety will decrease Outcome: Progressing Goal: Ability to modify response to factors that promote anxiety will improve Outcome: Progressing   Problem: Education: Goal: Utilization of techniques to improve thought processes will improve Outcome: Progressing Goal: Knowledge of the prescribed therapeutic regimen will improve Outcome: Progressing   Problem: Activity: Goal: Interest or engagement in leisure activities will improve Outcome: Progressing Goal: Imbalance in normal sleep/wake cycle will improve Outcome: Progressing   Problem: Coping: Goal: Coping ability will improve Outcome: Progressing Goal: Will verbalize feelings Outcome: Progressing   Problem: Health Behavior/Discharge Planning: Goal: Ability to make decisions will improve Outcome: Progressing Goal: Compliance with therapeutic regimen will improve Outcome: Progressing   Problem: Education: Goal: Knowledge of [place] Education information/materials will improve Outcome: Progressing Goal:  Emotional status will improve Outcome: Progressing Goal: Mental status will improve Outcome: Progressing Goal: Verbalization of understanding the information provided will improve Outcome: Progressing   Problem: Activity: Goal: Interest or engagement in activities will improve Outcome: Progressing Goal: Sleeping patterns will improve Outcome: Progressing   Problem: Coping: Goal: Ability to verbalize frustrations and anger appropriately will improve Outcome: Progressing Goal: Ability to demonstrate self-control will improve Outcome: Progressing   Problem: Health Behavior/Discharge Planning: Goal: Identification of resources available to assist in meeting health care needs will improve Outcome: Progressing Goal: Compliance with treatment plan for underlying cause of condition will improve Outcome: Progressing   Problem: Physical Regulation: Goal: Ability to maintain clinical measurements within normal limits will improve Outcome: Progressing   Problem: Safety: Goal: Periods of time without injury will increase Outcome: Progressing   Problem: Activity: Goal: Will identify at least one activity in which they can participate Outcome: Progressing   Problem: Coping: Goal: Ability to identify and develop effective coping behavior will improve Outcome: Progressing Goal: Ability to interact with others will improve Outcome: Progressing Goal: Demonstration of participation in decision-making regarding own care will improve Outcome: Progressing Goal: Ability to use eye contact when communicating with others will improve Outcome: Progressing   Problem: Health Behavior/Discharge Planning: Goal: Identification of resources available to assist in meeting health care needs will improve Outcome: Progressing   Problem: Self-Concept: Goal: Will verbalize positive feelings about self Outcome: Progressing

## 2019-03-24 NOTE — [facility] Group Notes (Signed)
[facility] Group Notes:  (Nursing/MHT/Case Management/Adjunct)  Date:  03/24/2019  Time:  2:27 PM  Type of Therapy:  Psychoeducational Skills  Participation Level:  Active  Participation Quality:  Appropriate, Sharing and Supportive  Affect:  Appropriate  Cognitive:  Appropriate and Oriented  Insight:  Appropriate, Good and Improving  Engagement in Group:  Engaged and Improving  Modes of Intervention:  Socialization and Support  Summary of Progress/Problems:  Sandra Chavez 03/24/2019, 2:27 PM

## 2019-03-24 NOTE — Plan of Care (Signed)
Problem: Education: Goal: Ability to state activities that reduce stress will improve 03/24/2019 0643 by Lelan PonsAriwodo, Jajuan Skoog, RN Outcome: Progressing 03/24/2019 0631 by Lelan PonsAriwodo, Socorro Ebron, RN Outcome: Progressing   Problem: Coping: Goal: Ability to identify and develop effective coping behavior will improve 03/24/2019 0643 by Lelan PonsAriwodo, Maurie Olesen, RN Outcome: Progressing 03/24/2019 0631 by Lelan PonsAriwodo, Husam Hohn, RN Outcome: Progressing   Problem: Self-Concept: Goal: Ability to identify factors that promote anxiety will improve 03/24/2019 0643 by Lelan PonsAriwodo, Ashana Tullo, RN Outcome: Progressing 03/24/2019 0631 by Lelan PonsAriwodo, Branko Steeves, RN Outcome: Progressing Goal: Level of anxiety will decrease 03/24/2019 0643 by Lelan PonsAriwodo, Lakea Mittelman, RN Outcome: Progressing 03/24/2019 0631 by Lelan PonsAriwodo, Uzair Godley, RN Outcome: Progressing Goal: Ability to modify response to factors that promote anxiety will improve 03/24/2019 0643 by Lelan PonsAriwodo, Sherrian Nunnelley, RN Outcome: Progressing 03/24/2019 0631 by Lelan PonsAriwodo, Corneluis Allston, RN Outcome: Progressing   Problem: Education: Goal: Utilization of techniques to improve thought processes will improve 03/24/2019 0643 by Lelan PonsAriwodo, Jamielyn Petrucci, RN Outcome: Progressing 03/24/2019 0631 by Lelan PonsAriwodo, Anselma Herbel, RN Outcome: Progressing Goal: Knowledge of the prescribed therapeutic regimen will improve 03/24/2019 0643 by Lelan PonsAriwodo, Marlaysia Lenig, RN Outcome: Progressing 03/24/2019 0631 by Lelan PonsAriwodo, Novak Stgermaine, RN Outcome: Progressing   Problem: Activity: Goal: Interest or engagement in leisure activities will improve 03/24/2019 0643 by Lelan PonsAriwodo, Aldyn Toon, RN Outcome: Progressing 03/24/2019 0631 by Lelan PonsAriwodo, Joselin Crandell, RN Outcome: Progressing Goal: Imbalance in normal sleep/wake cycle will improve 03/24/2019 0643 by Lelan PonsAriwodo, Goldman Birchall, RN Outcome: Progressing 03/24/2019 0631 by Lelan PonsAriwodo, Lanorris Kalisz, RN Outcome: Progressing   Problem: Coping: Goal: Coping ability will improve 03/24/2019 0643 by Lelan PonsAriwodo,  Ercel Normoyle, RN Outcome: Progressing 03/24/2019 0631 by Lelan PonsAriwodo, Kewanda Poland, RN Outcome: Progressing Goal: Will verbalize feelings 03/24/2019 0643 by Lelan PonsAriwodo, Loura Pitt, RN Outcome: Progressing 03/24/2019 0631 by Lelan PonsAriwodo, Eara Burruel, RN Outcome: Progressing   Problem: Health Behavior/Discharge Planning: Goal: Ability to make decisions will improve 03/24/2019 0643 by Lelan PonsAriwodo, Aislin Onofre, RN Outcome: Progressing 03/24/2019 0631 by Lelan PonsAriwodo, Azula Zappia, RN Outcome: Progressing Goal: Compliance with therapeutic regimen will improve 03/24/2019 0643 by Lelan PonsAriwodo, Lounette Sloan, RN Outcome: Progressing 03/24/2019 0631 by Lelan PonsAriwodo, Edra Riccardi, RN Outcome: Progressing   Problem: Education: Goal: Knowledge of [facility] Education information/materials will improve 03/24/2019 0643 by Lelan PonsAriwodo, Brenlyn Beshara, RN Outcome: Progressing 03/24/2019 0631 by Lelan PonsAriwodo, Neytiri Asche, RN Outcome: Progressing Goal: Emotional status will improve 03/24/2019 0643 by Lelan PonsAriwodo, Khristi Schiller, RN Outcome: Progressing 03/24/2019 0631 by Lelan PonsAriwodo, Machelle Raybon, RN Outcome: Progressing Goal: Mental status will improve 03/24/2019 0643 by Lelan PonsAriwodo, Sherlon Nied, RN Outcome: Progressing 03/24/2019 0631 by Lelan PonsAriwodo, Makyah Lavigne, RN Outcome: Progressing Goal: Verbalization of understanding the information provided will improve 03/24/2019 0643 by Lelan PonsAriwodo, Jeson Camacho, RN Outcome: Progressing 03/24/2019 0631 by Lelan PonsAriwodo, Lyda Colcord, RN Outcome: Progressing   Problem: Activity: Goal: Interest or engagement in activities will improve 03/24/2019 0643 by Lelan PonsAriwodo, Jettson Crable, RN Outcome: Progressing 03/24/2019 0631 by Lelan PonsAriwodo, Kemisha Bonnette, RN Outcome: Progressing Goal: Sleeping patterns will improve 03/24/2019 0643 by Lelan PonsAriwodo, Wren Gallaga, RN Outcome: Progressing 03/24/2019 0631 by Lelan PonsAriwodo, Kinney Sackmann, RN Outcome: Progressing   Problem: Coping: Goal: Ability to verbalize frustrations and anger appropriately will improve 03/24/2019 0643 by Lelan PonsAriwodo, Almon Whitford,  RN Outcome: Progressing 03/24/2019 0631 by Lelan PonsAriwodo, Rodd Heft, RN Outcome: Progressing Goal: Ability to demonstrate self-control will improve 03/24/2019 0643 by Lelan PonsAriwodo, Jacqlyn Marolf, RN Outcome: Progressing 03/24/2019 0631 by Lelan PonsAriwodo, Tyshaun Vinzant, RN Outcome: Progressing   Problem: Health Behavior/Discharge Planning: Goal: Identification of resources available to assist in meeting health care needs will improve 03/24/2019 0643 by Lelan PonsAriwodo, Stephane Niemann, RN Outcome: Progressing 03/24/2019 0631 by Lelan PonsAriwodo, Jostin Rue, RN Outcome: Progressing Goal: Compliance with treatment plan for underlying cause of condition will improve 03/24/2019 0643 by Lelan PonsAriwodo, Deshawn Witty, RN Outcome:  Progressing 03/24/2019 0631 by Lelan Pons, RN Outcome: Progressing   Problem: Physical Regulation: Goal: Ability to maintain clinical measurements within normal limits will improve 03/24/2019 0643 by Lelan Pons, RN Outcome: Progressing 03/24/2019 0631 by Lelan Pons, RN Outcome: Progressing   Problem: Safety: Goal: Periods of time without injury will increase 03/24/2019 0643 by Lelan Pons, RN Outcome: Progressing 03/24/2019 0631 by Lelan Pons, RN Outcome: Progressing   Problem: Activity: Goal: Will identify at least one activity in which they can participate 03/24/2019 0643 by Lelan Pons, RN Outcome: Progressing 03/24/2019 0631 by Lelan Pons, RN Outcome: Progressing   Problem: Coping: Goal: Ability to identify and develop effective coping behavior will improve 03/24/2019 0643 by Lelan Pons, RN Outcome: Progressing 03/24/2019 0631 by Lelan Pons, RN Outcome: Progressing Goal: Ability to interact with others will improve 03/24/2019 0643 by Lelan Pons, RN Outcome: Progressing 03/24/2019 0631 by Lelan Pons, RN Outcome: Progressing Goal: Demonstration of participation in decision-making regarding own care will improve 03/24/2019 0643 by  Lelan Pons, RN Outcome: Progressing 03/24/2019 0631 by Lelan Pons, RN Outcome: Progressing Goal: Ability to use eye contact when communicating with others will improve 03/24/2019 0643 by Lelan Pons, RN Outcome: Progressing 03/24/2019 0631 by Lelan Pons, RN Outcome: Progressing   Problem: Health Behavior/Discharge Planning: Goal: Identification of resources available to assist in meeting health care needs will improve 03/24/2019 0643 by Lelan Pons, RN Outcome: Progressing 03/24/2019 0631 by Lelan Pons, RN Outcome: Progressing   Problem: Self-Concept: Goal: Will verbalize positive feelings about self 03/24/2019 0643 by Lelan Pons, RN Outcome: Progressing 03/24/2019 0631 by Lelan Pons, RN Outcome: Progressing

## 2019-03-24 NOTE — Progress Notes (Signed)
D: Patient stated slept good last night .Stated appetite is good and energy level  Is normal. Stated concentration is good . Stated on Depression scale 0, hopeless 0 and anxiety 2 .( low 0-10 high) Denies suicidal  homicidal ideations  .  No auditory hallucinations  No pain concerns . Appropriate ADL'S. Interacting with peers and staff. Patient able to say positive  attributes  about self . Instructed on [facility] Education  and unit programing . Verbalizing understanding .Patient able to  attend unit programing . Able to verbalize   and understanding of information  received . Working on anxiety , coping and decision making skills .  Compliant wit medication , understanding of medication given . Thought process improving . Voice non concerns around  wake and sleep cycle.   Emotional and mental status  improving . No anger outburst  or control issues . Patient voice of no safety  concerns . Ey contact improving  during conversations .  Patient aware of dischare tomorrow   A: Encourage patient participation with unit programming . Instruction  Given on  Medication , verbalize understanding.  R: Voice no other concerns. Staff continue to monitor

## 2019-03-24 NOTE — Progress Notes (Signed)
[facility]  03/24/2019 3:15 PM Nehal Willette Cluster Hinz  MRN:  [id number] Subjective: Patient seen and chart reviewed.  Patient says she is feeling much better.  Slept okay last night.  Energy level and attention are improved today.  No side effects of medicine.  Feels more capable of making calm reasonable decisions.  Able to articulate appropriate plans for the future.  Not having acute flashbacks or agitation Principal Problem: PTSD (post-traumatic stress disorder) Diagnosis: Principal Problem:   PTSD (post-traumatic stress disorder) Active Problems:   Seizures (HCC)   Attention deficit hyperactivity disorder (ADHD)  Total Time spent with patient: 30 minutes  Past Psychiatric History: History of chronic mood instability probably multifactorial  Past Medical History:  Past Medical History:  Diagnosis Date  . Seizures (HCC)   . Von Willebrand disease (HCC)     Past Surgical History:  Procedure Laterality Date  . BREAST BIOPSY    . LEEP     Family History: History reviewed. No pertinent family history. Family Psychiatric  History: See previous Social History:  Social History   Substance and Sexual Activity  Alcohol Use Never  . Frequency: Never     Social History   Substance and Sexual Activity  Drug Use Never    Social History   Socioeconomic History  . Marital status: Single    Spouse name: Not on file  . Number of children: Not on file  . Years of education: Not on file  . Highest education level: Not on file  Occupational History  . Not on file  Social Needs  . Financial resource strain: Not on file  . Food insecurity:    Worry: Not on file    Inability: Not on file  . Transportation needs:    Medical: Not on file    Non-medical: Not on file  Tobacco Use  . Smoking status: Never Smoker  . Smokeless tobacco: Never Used  Substance and Sexual Activity  . Alcohol use: Never    Frequency: Never  . Drug use: Never  . Sexual activity: Not on file   Lifestyle  . Physical activity:    Days per week: Not on file    Minutes per session: Not on file  . Stress: Not on file  Relationships  . Social connections:    Talks on phone: Not on file    Gets together: Not on file    Attends religious service: Not on file    Active member of club or organization: Not on file    Attends meetings of clubs or organizations: Not on file    Relationship status: Not on file  Other Topics Concern  . Not on file  Social History Narrative  . Not on file   Additional Social History:                         Sleep: Fair  Appetite:  Fair  Current Medications: Current Facility-Administered Medications  Medication Dose Route Frequency Provider Last Rate Last Dose  . acetaminophen (TYLENOL) tablet 650 mg  650 mg Oral Q6H PRN Mariel CraftMaurer, Sheila M, MD      . alum & mag hydroxide-simeth (MAALOX/MYLANTA) 200-200-20 MG/5ML suspension 30 mL  30 mL Oral Q4H PRN Mariel CraftMaurer, Sheila M, MD      . amphetamine-dextroamphetamine (ADDERALL) tablet 10 mg  10 mg Oral BID WC Mahari Strahm, Jackquline DenmarkJohn T, MD   10 mg at 03/24/19 [id number]  . diphenhydrAMINE (BENADRYL) capsule 50 mg  50 mg Oral QHS PRN Mariel Craft, MD      . FLUoxetine (PROZAC) capsule 20 mg  20 mg Oral Daily Mariel Craft, MD   20 mg at 03/24/19 4098  . hydrOXYzine (ATARAX/VISTARIL) tablet 25 mg  25 mg Oral TID PRN Mariel Craft, MD      . levETIRAcetam (KEPPRA) tablet 1,000 mg  1,000 mg Oral BID Antwine Agosto, Jackquline Denmark, MD   1,000 mg at 03/24/19 1191  . magnesium hydroxide (MILK OF MAGNESIA) suspension 30 mL  30 mL Oral Daily PRN Mariel Craft, MD      . prazosin (MINIPRESS) capsule 1 mg  1 mg Oral QHS Glennice Marcos T, MD   1 mg at 03/23/19 2156    Lab Results: No results found for this or any previous visit (from the past 48 hour(s)).  Blood Alcohol level:  Lab Results  Component Value Date   ETH <10 03/21/2019    Metabolic Disorder Labs: No results found for: HGBA1C, MPG No results found for:  PROLACTIN No results found for: CHOL, TRIG, HDL, CHOLHDL, VLDL, LDLCALC  Physical Findings: AIMS: Facial and Oral Movements Muscles of Facial Expression: None, normal Lips and Perioral Area: None, normal Jaw: None, normal Tongue: None, normal,Extremity Movements Upper (arms, wrists, hands, fingers): None, normal Lower (legs, knees, ankles, toes): None, normal, Trunk Movements Neck, shoulders, hips: None, normal, Overall Severity Severity of abnormal movements (highest score from questions above): None, normal Incapacitation due to abnormal movements: None, normal Patient's awareness of abnormal movements (rate only patient's report): No Awareness, Dental Status Current problems with teeth and/or dentures?: No Does patient usually wear dentures?: No  CIWA:    COWS:     Musculoskeletal: Strength & Muscle Tone: within normal limits Gait & Station: normal Patient leans: N/A  Psychiatric Specialty Exam: Physical Exam  Nursing note and vitals reviewed. Constitutional: She appears well-developed and well-nourished.  HENT:  Head: Normocephalic and atraumatic.  Eyes: Pupils are equal, round, and reactive to light. Conjunctivae are normal.  Neck: Normal range of motion.  Cardiovascular: Regular rhythm and normal heart sounds.  Respiratory: Effort normal. No respiratory distress.  GI: Soft.  Musculoskeletal: Normal range of motion.  Neurological: She is alert.  Skin: Skin is warm and dry.  Psychiatric: She has a normal mood and affect. Her behavior is normal. Judgment and thought content normal.    Review of Systems  Constitutional: Negative.   HENT: Negative.   Eyes: Negative.   Respiratory: Negative.   Cardiovascular: Negative.   Gastrointestinal: Negative.   Musculoskeletal: Negative.   Skin: Negative.   Neurological: Negative.   Psychiatric/Behavioral: Negative.     Blood pressure 114/73, pulse 98, temperature 98.2 F (36.8 C), temperature source Oral, resp. rate 18,  height  (1.702 m), weight 77.1 kg, SpO2 100 %.Body mass index is 26.62 kg/m.  General Appearance: Casual  Eye Contact:  Good  Speech:  Clear and Coherent  Volume:  Normal  Mood:  Euthymic  Affect:  Congruent  Thought Process:  Coherent  Orientation:  Full (Time, Place, and Person)  Thought Content:  Logical  Suicidal Thoughts:  No  Homicidal Thoughts:  No  Memory:  Immediate;   Fair Recent;   Fair Remote;   Fair  Judgement:  Fair  Insight:  Fair  Psychomotor Activity:  Normal  Concentration:  Concentration: Fair  Recall:  [employer] of Knowledge:  Fair  Language:  Fair  Akathisia:  No  Handed:  Right  AIMS (if indicated):     Assets:  Desire for Improvement Physical Health Resilience  ADL's:  Intact  Cognition:  WNL  Sleep:  Number of Hours: 8     Treatment Plan Summary: Daily contact with patient to assess and evaluate symptoms and progress in treatment, Medication management and Plan Spoke with patient about discharge planning.  Patient is feeling confident and upbeat enough to consider discharge tomorrow.  She will be referred for appropriate outpatient treatment in the community.  I will see if I can get a brief supply of her medicine as well as prescriptions prepared.  Supportive counseling and review of medicines and their utility.  Encouragement and following up with therapy.  Patient at this point does not appear to meet commitment criteria any further no signs of ongoing dangerousness.  Mordecai Rasmussen, MD 03/24/2019, 3:15 PM

## 2019-03-24 NOTE — Plan of Care (Signed)
  Problem: Education: Goal: Ability to state activities that reduce stress will improve Outcome: Progressing   Problem: Coping: Goal: Ability to identify and develop effective coping behavior will improve Outcome: Progressing   Problem: Self-Concept: Goal: Ability to identify factors that promote anxiety will improve Outcome: Progressing Goal: Level of anxiety will decrease Outcome: Progressing Goal: Ability to modify response to factors that promote anxiety will improve Outcome: Progressing   Problem: Education: Goal: Utilization of techniques to improve thought processes will improve Outcome: Progressing Goal: Knowledge of the prescribed therapeutic regimen will improve Outcome: Progressing   Problem: Activity: Goal: Interest or engagement in leisure activities will improve Outcome: Progressing Goal: Imbalance in normal sleep/wake cycle will improve Outcome: Progressing   Problem: Coping: Goal: Coping ability will improve Outcome: Progressing Goal: Will verbalize feelings Outcome: Progressing   Problem: Health Behavior/Discharge Planning: Goal: Ability to make decisions will improve Outcome: Progressing Goal: Compliance with therapeutic regimen will improve Outcome: Progressing   Problem: Education: Goal: Knowledge of [facility] Education information/materials will improve Outcome: Progressing Goal: Emotional status will improve Outcome: Progressing Goal: Mental status will improve Outcome: Progressing Goal: Verbalization of understanding the information provided will improve Outcome: Progressing   Problem: Activity: Goal: Interest or engagement in activities will improve Outcome: Progressing Goal: Sleeping patterns will improve Outcome: Progressing   Problem: Coping: Goal: Ability to verbalize frustrations and anger appropriately will improve Outcome: Progressing Goal: Ability to demonstrate self-control will improve Outcome: Progressing    Problem: Health Behavior/Discharge Planning: Goal: Identification of resources available to assist in meeting health care needs will improve Outcome: Progressing Goal: Compliance with treatment plan for underlying cause of condition will improve Outcome: Progressing   Problem: Physical Regulation: Goal: Ability to maintain clinical measurements within normal limits will improve Outcome: Progressing   Problem: Safety: Goal: Periods of time without injury will increase Outcome: Progressing   Problem: Activity: Goal: Will identify at least one activity in which they can participate Outcome: Progressing   Problem: Coping: Goal: Ability to identify and develop effective coping behavior will improve Outcome: Progressing Goal: Ability to interact with others will improve Outcome: Progressing Goal: Demonstration of participation in decision-making regarding own care will improve Outcome: Progressing Goal: Ability to use eye contact when communicating with others will improve Outcome: Progressing   Problem: Health Behavior/Discharge Planning: Goal: Identification of resources available to assist in meeting health care needs will improve Outcome: Progressing   Problem: Self-Concept: Goal: Will verbalize positive feelings about self Outcome: Progressing

## 2019-03-24 NOTE — Progress Notes (Signed)
Recreation Therapy Notes  Date: 03/24/2019  Time: 9:30 am  Location: Craft room  Behavioral response: Appropriate   Intervention Topic: Decision Making   Discussion/Intervention:  Group content today was focused on Decision making. The group defined decision making and some positive ways they make decisions for themselves. Individuals expressed reasons why they neglected any decision making in the past. Patients described ways to improve decision making skills in the future. The group explained what could happen if they did not do any decision making at all. Participants express how bad decision has affected them and others around them. Individual explained the importance of decision making. The group participated in the intervention "Making decisions" where they had a chance to discover some of their weaknesses and strengths in decision making. Patient came up with a new decision-making skill to improve themselves in the future.   Clinical Observations/Feedback:  Patient came to group and defined decision making as right and wrong. She explained that she makes decision on how things are going to affect her and others. Participant expressed that bad decisions equals trouble. Patient identified that she could improve on thinking rational when making decisions. Individual was social with staff while participating in the intervention. Ilda Laskin LRT/CTRS         Gus Littler 03/24/2019 11:43 AM

## 2019-03-24 NOTE — Progress Notes (Signed)
Patient is calm and stable, resting in her room upon approach, responded with good mood and affects, voice no physical concerns, states that" I am doing fine" took her medications as ordered with out any side effects. Patient denies any SI/HI/ AVH , but continue to endorse depressions and anxiety at 6/10 scale , support and encouragement is provided , encouraged socializing , to spend some leisured time with peers and participate in scheduled activities, patient acknowledged, sleep is continuous only requiring 15 minutes safety roundings for safety.

## 2019-03-24 NOTE — BHH Suicide Risk Assessment (Signed)
[facility] Discharge Suicide Risk Assessment   Principal Problem: PTSD (post-traumatic stress disorder) Discharge Diagnoses: Principal Problem:   PTSD (post-traumatic stress disorder) Active Problems:   Seizures (HCC)   Attention deficit hyperactivity disorder (ADHD)   Total Time spent with patient: 45 minutes  Musculoskeletal: Strength & Muscle Tone: within normal limits Gait & Station: normal Patient leans: N/A  Psychiatric Specialty Exam: Review of Systems  Constitutional: Negative.   HENT: Negative.   Eyes: Negative.   Respiratory: Negative.   Cardiovascular: Negative.   Gastrointestinal: Negative.   Musculoskeletal: Negative.   Skin: Negative.   Neurological: Negative.   Psychiatric/Behavioral: Negative.     Blood pressure 114/73, pulse 98, temperature 98.2 F (36.8 C), temperature source Oral, resp. rate 18, height 5\' 7"  (1.702 m), weight 77.1 kg, SpO2 100 %.Body mass index is 26.62 kg/m.  General Appearance: Casual  Eye Contact::  Good  Speech:  Clear and Coherent409  Volume:  Normal  Mood:  Euthymic  Affect:  Congruent  Thought Process:  Goal Directed  Orientation:  Full (Time, Place, and Person)  Thought Content:  Logical  Suicidal Thoughts:  No  Homicidal Thoughts:  No  Memory:  Immediate;   Fair Recent;   Fair Remote;   Fair  Judgement:  Fair  Insight:  Fair  Psychomotor Activity:  Normal  Concentration:  Fair  Recall:  [employer] of Knowledge:Fair  Language: Fair  Akathisia:  No  Handed:  Right  AIMS (if indicated):     Assets:  Desire for Improvement Housing Physical Health Resilience  Sleep:  Number of Hours: 8  Cognition: WNL  ADL's:  Intact   Mental Status Per Nursing Assessment::   On Admission:  NA  Demographic Factors:  Unemployed  Loss Factors: Financial problems/change in socioeconomic status  Historical Factors: Impulsivity  Risk Reduction Factors:   Positive social support and Positive therapeutic relationship  Continued  Clinical Symptoms:  Depression:   Impulsivity  Cognitive Features That Contribute To Risk:  None    Suicide Risk:  Minimal: No identifiable suicidal ideation.  Patients presenting with no risk factors but with morbid ruminations; may be classified as minimal risk based on the severity of the depressive symptoms  Follow-up Information    [facility] Follow up.   Why:  Please attend your appointment on 04/01/2019 at 12:30am. Contact information: [address] [place] [address] [phone]           Plan Of Care/Follow-up recommendations:  Activity:  Activity as tolerated Diet:  Regular diet Other:  Follow-up with outpatient care as recommended with [facility]  Mordecai Rasmussen, MD 03/24/2019, 3:22 PM

## 2019-03-24 NOTE — Plan of Care (Signed)
Patient able to say positive  attributes  about self . Instructed on [facility] Education  and unit programing . Verbalizing understanding .Patient able to  attend unit programing . Able to verbalize   and understanding of information  received . Working on anxiety , coping and decision making skills .  Compliant wit medication , understanding of medication given . Thought process improving . Voice non concerns around  wake and sleep cycle.   Emotional and mental status  improving . No anger outburst  or control issues . Patient voice of no safety  concerns . Ey contact improving  during conversations .    Problem: Education: Goal: Ability to state activities that reduce stress will improve Outcome: Progressing   Problem: Coping: Goal: Ability to identify and develop effective coping behavior will improve Outcome: Progressing   Problem: Self-Concept: Goal: Ability to identify factors that promote anxiety will improve Outcome: Progressing Goal: Level of anxiety will decrease Outcome: Progressing Goal: Ability to modify response to factors that promote anxiety will improve Outcome: Progressing   Problem: Education: Goal: Utilization of techniques to improve thought processes will improve Outcome: Progressing Goal: Knowledge of the prescribed therapeutic regimen will improve Outcome: Progressing   Problem: Activity: Goal: Interest or engagement in leisure activities will improve Outcome: Progressing Goal: Imbalance in normal sleep/wake cycle will improve Outcome: Progressing   Problem: Coping: Goal: Coping ability will improve Outcome: Progressing Goal: Will verbalize feelings Outcome: Progressing   Problem: Health Behavior/Discharge Planning: Goal: Ability to make decisions will improve Outcome: Progressing Goal: Compliance with therapeutic regimen will improve Outcome: Progressing   Problem: Education: Goal: Knowledge of [place] [facility] Education information/materials  will improve Outcome: Progressing Goal: Emotional status will improve Outcome: Progressing Goal: Mental status will improve Outcome: Progressing Goal: Verbalization of understanding the information provided will improve Outcome: Progressing   Problem: Activity: Goal: Interest or engagement in activities will improve Outcome: Progressing Goal: Sleeping patterns will improve Outcome: Progressing   Problem: Coping: Goal: Ability to verbalize frustrations and anger appropriately will improve Outcome: Progressing Goal: Ability to demonstrate self-control will improve Outcome: Progressing   Problem: Health Behavior/Discharge Planning: Goal: Identification of resources available to assist in meeting health care needs will improve Outcome: Progressing Goal: Compliance with treatment plan for underlying cause of condition will improve Outcome: Progressing   Problem: Physical Regulation: Goal: Ability to maintain clinical measurements within normal limits will improve Outcome: Progressing   Problem: Safety: Goal: Periods of time without injury will increase Outcome: Progressing   Problem: Activity: Goal: Will identify at least one activity in which they can participate Outcome: Progressing   Problem: Coping: Goal: Ability to identify and develop effective coping behavior will improve Outcome: Progressing Goal: Ability to interact with others will improve Outcome: Progressing Goal: Demonstration of participation in decision-making regarding own care will improve Outcome: Progressing Goal: Ability to use eye contact when communicating with others will improve Outcome: Progressing   Problem: Health Behavior/Discharge Planning: Goal: Identification of resources available to assist in meeting health care needs will improve Outcome: Progressing   Problem: Self-Concept: Goal: Will verbalize positive feelings about self Outcome: Progressing

## 2019-03-24 NOTE — BHH Group Notes (Signed)
LCSW Group Therapy Note  03/24/2019 11:50 AM  Type of Therapy/Topic:  Group Therapy:  Balance in Life  Participation Level:  Active  Description of Group:    This group will address the concept of balance and how it feels and looks when one is unbalanced. Patients will be encouraged to process areas in their lives that are out of balance and identify reasons for remaining unbalanced. Facilitators will guide patients in utilizing problem-solving interventions to address and correct the stressor making their life unbalanced. Understanding and applying boundaries will be explored and addressed for obtaining and maintaining a balanced life. Patients will be encouraged to explore ways to assertively make their unbalanced needs known to significant others in their lives, using other group members and facilitator for support and feedback.  Therapeutic Goals: 1. Patient will identify two or more emotions or situations they have that consume much of in their lives. 2. Patient will identify signs/triggers that life has become out of balance:  3. Patient will identify two ways to set boundaries in order to achieve balance in their lives:  4. Patient will demonstrate ability to communicate their needs through discussion and/or role plays  Summary of Patient Progress: Pt was appropriate and respectful in group. Pt was able to identify balance in life as "having peace". Pt reported that people throw her off balance, crowded places, unemployment, and moving to [place] from [place]. Pt was active in group and discussed issues with her mother throwing her off balance. Pt reported medicine as a way to maintain balance because it keeps her calm. Pt identified music as a coping strategy to help her maintain balance.     Therapeutic Modalities:   Cognitive Behavioral Therapy Solution-Focused Therapy Assertiveness Training  Iris Pert, MSW, LCSW Clinical Social Work 03/24/2019 11:50 AM

## 2019-03-25 NOTE — Discharge Summary (Signed)
Physician Discharge Summary Note  Patient:  Sandra Chavez is an 28 y.o., female MRN:  [id number] DOB:  22-May-1991 Patient phone:  [phone] (home)  Patient address:   [address] [place] [address],  Total Time spent with patient: 45 minutes  Date of Admission:  03/22/2019 Date of Discharge: Mar 25, 2019  Reason for Admission: Admitted through the emergency room where she presented with mood instability depression and passive suicidal ideation  Principal Problem: PTSD (post-traumatic stress disorder) Discharge Diagnoses: Principal Problem:   PTSD (post-traumatic stress disorder) Active Problems:   Seizures (HCC)   Attention deficit hyperactivity disorder (ADHD)   Past Psychiatric History: Past history of treatment for ADHD.  Chronic anxiety and mood lability.  Past Medical History:  Past Medical History:  Diagnosis Date  . Seizures (HCC)   . Von Willebrand disease (HCC)     Past Surgical History:  Procedure Laterality Date  . BREAST BIOPSY    . LEEP     Family History: History reviewed. No pertinent family history. Family Psychiatric  History: None reported Social History:  Social History   Substance and Sexual Activity  Alcohol Use Never  . Frequency: Never     Social History   Substance and Sexual Activity  Drug Use Never    Social History   Socioeconomic History  . Marital status: Single    Spouse name: Not on file  . Number of children: Not on file  . Years of education: Not on file  . Highest education level: Not on file  Occupational History  . Not on file  Social Needs  . Financial resource strain: Not on file  . Food insecurity:    Worry: Not on file    Inability: Not on file  . Transportation needs:    Medical: Not on file    Non-medical: Not on file  Tobacco Use  . Smoking status: Never Smoker  . Smokeless tobacco: Never Used  Substance and Sexual Activity  . Alcohol use: Never    Frequency: Never  . Drug use:  Never  . Sexual activity: Not on file  Lifestyle  . Physical activity:    Days per week: Not on file    Minutes per session: Not on file  . Stress: Not on file  Relationships  . Social connections:    Talks on phone: Not on file    Gets together: Not on file    Attends religious service: Not on file    Active member of club or organization: Not on file    Attends meetings of clubs or organizations: Not on file    Relationship status: Not on file  Other Topics Concern  . Not on file  Social History Narrative  . Not on file    Hospital Course: Patient admitted to the psychiatric hospital.  Had full evaluation by treatment team.  Did not display any dangerous or suicidal or aggressive behaviors.  After interviewing her I felt there was a strong possibility that adult ADHD continued to be part of her problem.  After reviewing the utility of stimulants and determining that she was not abusing substances I started her on a modest dose of Adderall.  Patient also started on fluoxetine and low-dose prazosin for anxiety and depression and nightmares.  Patient tolerated medicine with no complaints.  Continued Keppra in the hospital and did not have any seizure activity at all.  Participated appropriately in groups and therapy.  Patient was discharged denying any  suicidal ideation at all and [facility].  7-day supply and prescriptions given  Physical Findings: AIMS: Facial and Oral Movements Muscles of Facial Expression: None, normal Lips and Perioral Area: None, normal Jaw: None, normal Tongue: None, normal,Extremity Movements Upper (arms, wrists, hands, fingers): None, normal Lower (legs, knees, ankles, toes): None, normal, Trunk Movements Neck, shoulders, hips: None, normal, Overall Severity Severity of abnormal movements (highest score from questions above): None, normal Incapacitation due to abnormal movements: None, normal Patient's awareness of abnormal  movements (rate only patient's report): No Awareness, Dental Status Current problems with teeth and/or dentures?: No Does patient usually wear dentures?: No  CIWA:    COWS:     Musculoskeletal: Strength & Muscle Tone: within normal limits Gait & Station: normal Patient leans: N/A  Psychiatric Specialty Exam: Physical Exam  Nursing note and vitals reviewed. Constitutional: She appears well-developed and well-nourished.  HENT:  Head: Normocephalic and atraumatic.  Eyes: Pupils are equal, round, and reactive to light. Conjunctivae are normal.  Neck: Normal range of motion.  Cardiovascular: Regular rhythm and normal heart sounds.  Respiratory: Effort normal.  GI: Soft.  Musculoskeletal: Normal range of motion.  Neurological: She is alert.  Skin: Skin is warm and dry.  Psychiatric: She has a normal mood and affect. Her speech is normal and behavior is normal. Judgment and thought content normal. Cognition and memory are normal.    Review of Systems  Constitutional: Negative.   HENT: Negative.   Eyes: Negative.   Respiratory: Negative.   Cardiovascular: Negative.   Gastrointestinal: Negative.   Musculoskeletal: Negative.   Skin: Negative.   Neurological: Negative.   Psychiatric/Behavioral: Negative.     Blood pressure 109/74, pulse (!) 105, temperature 98 F (36.7 C), temperature source Oral, resp. rate 16, height 5\' 7"  (1.702 m), weight 77.1 kg, SpO2 100 %.Body mass index is 26.62 kg/m.  General Appearance: Casual  Eye Contact:  Good  Speech:  Clear and Coherent  Volume:  Normal  Mood:  Euthymic  Affect:  Appropriate  Thought Process:  Coherent  Orientation:  Full (Time, Place, and Person)  Thought Content:  Logical  Suicidal Thoughts:  No  Homicidal Thoughts:  No  Memory:  Immediate;   Fair Recent;   Fair Remote;   Fair  Judgement:  Fair  Insight:  Fair  Psychomotor Activity:  Normal  Concentration:  Concentration: Fair  Recall:  [employer]  Fund of Knowledge:  Fair   Language:  Fair  Akathisia:  No  Handed:  Right  AIMS (if indicated):     Assets:  Desire for Improvement Housing Physical Health Resilience Social Support  ADL's:  Intact  Cognition:  WNL  Sleep:  Number of Hours: 8     Have you used any form of tobacco in the last 30 days? (Cigarettes, Smokeless Tobacco, Cigars, and/or Pipes): No  Has this patient used any form of tobacco in the last 30 days? (Cigarettes, Smokeless Tobacco, Cigars, and/or Pipes) Yes, No  Blood Alcohol level:  Lab Results  Component Value Date   ETH <10 03/21/2019    Metabolic Disorder Labs:  No results found for: HGBA1C, MPG No results found for: PROLACTIN No results found for: CHOL, TRIG, HDL, CHOLHDL, VLDL, LDLCALC  See Psychiatric Specialty Exam and Suicide Risk Assessment completed by Attending Physician prior to discharge.  Discharge destination:  Home  Is patient on multiple antipsychotic therapies at discharge:  No   Has Patient had three or more failed trials of  antipsychotic monotherapy by history:  No  Recommended Plan for Multiple Antipsychotic Therapies: NA  Discharge Instructions    Diet - low sodium heart healthy   Complete by:  As directed    Increase activity slowly   Complete by:  As directed      Allergies as of 03/25/2019   No Known Allergies     Medication List    STOP taking these medications   Biotin 1000 MCG tablet   ferrous sulfate 325 (65 FE) MG tablet   folic acid 800 MCG tablet Commonly known as:  FOLVITE   vitamin B-6 250 MG tablet   Vitamin D3 125 MCG (5000 UT) Tabs     TAKE these medications     Indication  amphetamine-dextroamphetamine 5 MG tablet Commonly known as:  ADDERALL Take 2 tablets (10 mg total) by mouth 2 (two) times daily with a meal.  Indication:  Attention Deficit Hyperactivity Disorder   FLUoxetine 20 MG capsule Commonly known as:  PROZAC Take 1 capsule (20 mg total) by mouth daily.  Indication:  Depression   levETIRAcetam  1000 MG tablet Commonly known as:  KEPPRA Take 1 tablet (1,000 mg total) by mouth 2 (two) times a day. What changed:    medication strength  when to take this  Indication:  Seizure   prazosin 1 MG capsule Commonly known as:  MINIPRESS Take 1 capsule (1 mg total) by mouth at bedtime.  Indication:  Frightening Dreams      Follow-up Information    [facility] Follow up.   Why:  Please attend your appointment on 04/01/2019 at 12:30am. Contact information: [address] [place] [address] [phone]           Follow-up recommendations:  Activity:  Activity as tolerated Diet:  Regular diet Other:  Follow-up with [facility]  Comments: Patient was given a 7-day supply of medicines although we will not be able to give her a 7-day supply of controlled Adderall.  She has prescriptions for all of her medicine with refills although again cannot put a refill on the Adderall.  Patient is aware of this.  She will follow-up with [facility].  Signed: Mordecai Rasmussen, MD 03/25/2019, 12:04 PM

## 2019-03-25 NOTE — Progress Notes (Signed)
Recreation Therapy Notes   Date: 03/25/2019  Time: 9:30 am  Location: Craft room  Behavioral response: Appropriate   Intervention Topic: Relaxation   Discussion/Intervention:  Group content today was focused on relaxation. The group defined relaxation and identified healthy ways to relax. Individuals expressed how much time they spend relaxing. Patients expressed how much their life would be if they did not make time for themselves to relax. The group stated ways they could improve their relaxation techniques in the future.  Individuals participated in the intervention "Time to Relax" where they had a chance to experience different relaxation techniques.   Clinical Observations/Feedback:  Patient came to group and defined relaxation as peace and comfort. She identified playing video games, walking, listening to music, and coloring as ways she relaxes. Participant stated relaxation helps keep your mind right and body healthy. Individual was social with staff while participating in the intervention. Jane Broughton LRT/CTRS         Abundio Teuscher 03/25/2019 12:08 PM

## 2019-03-25 NOTE — Progress Notes (Signed)
  [facility] Adult Case Management Discharge Plan :  Will you be returning to the same living situation after discharge:  Yes,  pt lives with relatives At discharge, do you have transportation home?: Yes,  pt reports her mother or grandparents will provide transportation Do you have the ability to pay for your medications: Yes,  insurance  Release of information consent forms completed and in the chart;  Patient's signature needed at discharge.  Patient to Follow up at: Follow-up Information    [facility] Follow up.   Why:  Please attend your appointment on 04/01/2019 at 12:30am. Contact information: [address] [facility] [phone]           Next level of care provider has access to [facility] Link:no  Safety Planning and Suicide Prevention discussed: Yes,  Vella Redhead. mother  Have you used any form of tobacco in the last 30 days? (Cigarettes, Smokeless Tobacco, Cigars, and/or Pipes): No  Has patient been referred to the Quitline?: N/A patient is not a smoker  Patient has been referred for addiction treatment: N/A  Suzan Slick, LCSW 03/25/2019, 9:23 AM

## 2019-03-25 NOTE — Progress Notes (Signed)
Recreation Therapy Notes  INPATIENT RECREATION TR PLAN  Patient Details Name: Sandra Chavez MRN: [id number] DOB: [birth year] Today's Date: 03/25/2019  Rec Therapy Plan Is patient appropriate for Therapeutic Recreation?: Yes Treatment times per week: At least 3 Estimated Length of Stay: 5-7 days TR Treatment/Interventions: Group participation (Comment)  Discharge Criteria Pt will be discharged from therapy if:: Discharged Treatment plan/goals/alternatives discussed and agreed upon by:: Patient/family  Discharge Summary Short term goals set: Patient will identify 3 positive coping skills strategies to use for anxiety post d/c within 5 recreation therapy group sessions Short term goals met: Complete Progress toward goals comments: Groups attended Which groups?: Stress management, Other (Comment)(Relaxation, Decision making) Reason goals not met: N/A Therapeutic equipment acquired: N/A Reason patient discharged from therapy: Discharge from hospital Pt/family agrees with progress & goals achieved: Yes Date patient discharged from therapy: 03/25/19   Chinedum Vanhouten 03/25/2019, 1:43 PM

## 2019-03-25 NOTE — Progress Notes (Signed)
Patient ID: Sandra Chavez, female   DOB: 28-Oct-1991, 28 y.o.   MRN: [id number]  Discharge Note:  Patient denies SI/HI/AVH at this time. Discharge instructions, AVS, prescriptions, and transition record gone over with patient. Patient agrees to comply with medication management, follow-up visit, and outpatient therapy. Patient belongings returned to patient. Patient questions and concerns addressed and answered. Patient ambulatory off unit. Patient discharged to home with mother.

## 2019-04-11 ENCOUNTER — Other Ambulatory Visit: Payer: Self-pay | Admitting: Psychiatry

## 2019-04-11 ENCOUNTER — Telehealth: Payer: Self-pay

## 2019-04-11 MED ORDER — PRAZOSIN HCL 1 MG PO CAPS: 1.0000 mg | ORAL_CAPSULE | Freq: Every day | ORAL | 1 refills | Status: DC

## 2019-04-11 MED ORDER — FLUOXETINE HCL 20 MG PO CAPS: 20.0000 mg | ORAL_CAPSULE | Freq: Every day | ORAL | 1 refills | Status: DC

## 2019-04-11 MED ORDER — LEVETIRACETAM 1000 MG PO TABS: 1000.0000 mg | ORAL_TABLET | Freq: Two times a day (BID) | ORAL | 1 refills | Status: DC

## 2019-04-11 NOTE — Telephone Encounter (Signed)
pt called left message that she will not have enought of her medications to get to her next appt. [facility] was [facility] in [facility]

## 2019-05-30 ENCOUNTER — Other Ambulatory Visit: Payer: Self-pay | Admitting: Psychiatry

## 2019-05-30 MED ORDER — PRAZOSIN HCL 1 MG PO CAPS: 1.0000 mg | ORAL_CAPSULE | Freq: Every day | ORAL | 1 refills | Status: AC

## 2019-05-30 MED ORDER — FLUOXETINE HCL 20 MG PO CAPS: 20.0000 mg | ORAL_CAPSULE | Freq: Every day | ORAL | 1 refills | Status: AC

## 2019-05-30 MED ORDER — LEVETIRACETAM 1000 MG PO TABS: 1000.0000 mg | ORAL_TABLET | Freq: Two times a day (BID) | ORAL | 1 refills | Status: AC

## 2020-02-02 ENCOUNTER — Other Ambulatory Visit: Payer: Self-pay

## 2020-02-02 ENCOUNTER — Encounter: Payer: Self-pay | Admitting: Emergency Medicine

## 2020-02-02 ENCOUNTER — Emergency Department
Admission: EM | Admit: 2020-02-02 | Discharge: 2020-02-02 | Disposition: A | Discharge status: Home / Self Care | Payer: Medicaid Other | Attending: Student | Admitting: Student

## 2020-02-02 DIAGNOSIS — R569 Unspecified convulsions: Secondary | ICD-10-CM | POA: Diagnosis not present

## 2020-02-02 DIAGNOSIS — Z79899 Other long term (current) drug therapy: Secondary | ICD-10-CM | POA: Insufficient documentation

## 2020-02-02 LAB — BASIC METABOLIC PANEL
Anion gap: 8 (ref 5–15)
BUN: 13 mg/dL (ref 6–20)
CO2: 23 mmol/L (ref 22–32)
Calcium: 8.7 mg/dL — ABNORMAL LOW (ref 8.9–10.3)
Chloride: 106 mmol/L (ref 98–111)
Creatinine, Ser: 0.68 mg/dL (ref 0.44–1.00)
GFR calc Af Amer: >60 mL/min (ref >60)
GFR calc non Af Amer: >60 mL/min (ref >60)
Glucose, Bld: 99 mg/dL (ref 70–99)
Potassium: 4.2 mmol/L (ref 3.5–5.1)
Sodium: 137 mmol/L (ref 135–145)

## 2020-02-02 LAB — URINE DRUG SCREEN, QUALITATIVE (ARMC ONLY)
Amphetamines, Ur Screen: NOT DETECTED
Barbiturates, Ur Screen: NOT DETECTED
Benzodiazepine, Ur Scrn: NOT DETECTED
Cannabinoid 50 Ng, Ur ~~LOC~~: NOT DETECTED
Cocaine Metabolite,Ur ~~LOC~~: NOT DETECTED
MDMA (Ecstasy)Ur Screen: NOT DETECTED
Methadone Scn, Ur: NOT DETECTED
Opiate, Ur Screen: NOT DETECTED
Phencyclidine (PCP) Ur S: NOT DETECTED
Tricyclic, Ur Screen: NOT DETECTED

## 2020-02-02 LAB — URINALYSIS, COMPLETE (UACMP) WITH MICROSCOPIC
Bacteria, UA: NONE SEEN
Bilirubin Urine: NEGATIVE
Glucose, UA: NEGATIVE mg/dL
Hgb urine dipstick: NEGATIVE
Ketones, ur: NEGATIVE mg/dL
Leukocytes,Ua: NEGATIVE
Nitrite: NEGATIVE
Protein, ur: NEGATIVE mg/dL
Specific Gravity, Urine: 1.012 (ref 1.005–1.030)
pH: 7 (ref 5.0–8.0)

## 2020-02-02 LAB — CBC
HCT: 35 % — ABNORMAL LOW (ref 36.0–46.0)
Hemoglobin: 12.3 g/dL (ref 12.0–15.0)
MCH: 28.5 pg (ref 26.0–34.0)
MCHC: 35.1 g/dL (ref 30.0–36.0)
MCV: 81 fL (ref 80.0–100.0)
Platelets: 312 10*3/uL (ref 150–400)
RBC: 4.32 MIL/uL (ref 3.87–5.11)
RDW: 14.1 % (ref 11.5–15.5)
WBC: 6 10*3/uL (ref 4.0–10.5)
nRBC: 0 % (ref 0.0–0.2)

## 2020-02-02 LAB — POCT PREGNANCY, URINE: Preg Test, Ur: NEGATIVE

## 2020-02-02 LAB — HEPATIC FUNCTION PANEL
ALT: 17 U/L (ref 0–44)
AST: 20 U/L (ref 15–41)
Albumin: 4 g/dL (ref 3.5–5.0)
Alkaline Phosphatase: 69 U/L (ref 38–126)
Bilirubin, Direct: <0.1 mg/dL (ref 0.0–0.2)
Total Bilirubin: 0.9 mg/dL (ref 0.3–1.2)
Total Protein: 7.5 g/dL (ref 6.5–8.1)

## 2020-02-02 LAB — PREGNANCY, URINE: Preg Test, Ur: NEGATIVE

## 2020-02-02 MED ORDER — LORAZEPAM 1 MG PO TABS: 1.0000 mg | ORAL_TABLET | ORAL | 0 refills | Status: AC | PRN

## 2020-02-02 MED ORDER — LAMOTRIGINE 25 MG PO TABS: ORAL_TABLET | ORAL | 0 refills | Status: AC

## 2020-02-02 NOTE — ED Triage Notes (Signed)
Patient from home via [facility]. Per EMS, patient has had multiple seizures since last night. History of same. Patient states she has been compliant with meds. Sees Dr. Sherryll Burger at [facility]. Patient with continued tremors in right arm and head. Alert and oriented x4. Complaining of mild headache.

## 2020-02-02 NOTE — ED Provider Notes (Signed)
[facility] Emergency Department Provider Note  ____________________________________________   First MD Initiated Contact with Patient 02/02/20 1231     (approximate)  I have reviewed the triage vital signs and the nursing notes.  History  Chief Complaint Seizures    HPI  Sandra Chavez is a 29 y.o. female with hx of seizures, depression, ADHD, psychogenic non-epileptic seizures who presents for multiple seizures at home. Patient's partner is at bedside, states he has witnessed at least 4 seizures in the last 24-36 hours. He describes full body shaking, eyes rolling back, and period of confusion afterwards. No incontinence, no tongue biting. These seizure episodes last a few minutes before spontaneously stopping. No medications administered during these.   Normally she has a seizure every few weeks, so this frequency is higher than normal. Partner states she tends to seize when she is under greater stress or more emotional. She does report increased life stress recently. 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   She reports compliance with all her medications including her 1500 mg Keppra BID. No missed doses. No changes to medications or new over the counter medications. Denies any recent illness, no fever, cough, chest pain, abdominal pain, vomiting, diarrhea, urinary symptoms. Uses Nexplanon for birth control, denies possibility of pregnancy.    Past Medical Hx Past Medical History:  Diagnosis Date  . Seizures (HCC)   . Von Willebrand disease [facility])     Problem List Patient Active Problem List   Diagnosis Date Noted  . Attention deficit hyperactivity disorder (ADHD) 03/23/2019  . PTSD (post-traumatic stress disorder) 03/23/2019  . MDD (major depressive disorder), recurrent episode (HCC) 03/22/2019  . MDD (major depressive disorder), recurrent severe, without psychosis (HCC) 03/22/2019  . Seizures (HCC) 03/22/2019  . Psychogenic  nonepileptic seizure 03/22/2019  . Depression     Past Surgical Hx Past Surgical History:  Procedure Laterality Date  . BREAST BIOPSY    . LEEP      Medications Prior to Admission medications   Medication Sig Start Date End Date Taking? Authorizing Provider  amphetamine-dextroamphetamine (ADDERALL) 5 MG tablet Take 2 tablets (10 mg total) by mouth 2 (two) times daily with a meal. 03/24/19   Clapacs, Jackquline Denmark, MD  FLUoxetine (PROZAC) 20 MG capsule Take 1 capsule (20 mg total) by mouth daily. 05/30/19   Clapacs, Jackquline Denmark, MD  levETIRAcetam (KEPPRA) 1000 MG tablet Take 1 tablet (1,000 mg total) by mouth 2 (two) times a day. 05/30/19   Clapacs, Jackquline Denmark, MD  prazosin (MINIPRESS) 1 MG capsule Take 1 capsule (1 mg total) by mouth at bedtime. 05/30/19   Clapacs, Jackquline Denmark, MD    Allergies Patient has no known allergies.  Family Hx No family history on file.  Social Hx Social History   Tobacco Use  . Smoking status: Never Smoker  . Smokeless tobacco: Never Used  Substance Use Topics  . Alcohol use: Never  . Drug use: Never     Review of Systems  Constitutional: Negative for fever. Negative for chills. Eyes: Negative for visual changes. ENT: Negative for sore throat. Cardiovascular: Negative for chest pain. Respiratory: Negative for shortness of breath. Gastrointestinal: Negative for nausea. Negative for vomiting.  Genitourinary: Negative for dysuria. Musculoskeletal: Negative for leg swelling. Skin: Negative for rash. Neurological: Negative for headaches. + seizures   Physical Exam  Vital Signs: ED Triage Vitals  Enc Vitals Group     BP 02/02/20 0916 123/69     Pulse Rate 02/02/20  0916 88     Resp 02/02/20 0916 16     Temp 02/02/20 0916 98.5 F (36.9 C)     Temp Source 02/02/20 0916 Oral     SpO2 02/02/20 0916 99 %     Weight 02/02/20 0917 180 lb (81.6 kg)     Height 02/02/20 0917 5\' 7"  (1.702 m)     Head Circumference --      Peak Flow --      Pain Score 02/02/20 0917  5     Pain Loc --      Pain Edu? --      Excl. in [place]? --     Constitutional: Alert and oriented. Well appearing. NAD.  Head: Normocephalic. Atraumatic. Eyes: Conjunctivae clear. Sclera anicteric. Pupils equal and symmetric. Ears: Cerumen in bilateral EAC. Nose: No masses or lesions. No congestion or rhinorrhea. Mouth/Throat: Wearing mask. No lip or tongue lacerations or bites.  Neck: No stridor. Trachea midline.  Cardiovascular: Normal rate, regular rhythm. Extremities well perfused. Respiratory: Normal respiratory effort.  Lungs CTAB. Gastrointestinal: Soft. Non-distended. Non-tender.  Genitourinary: Deferred. Musculoskeletal: No lower extremity edema. No deformities. Neurologic:  Normal speech and language. No gross focal or lateralizing neurologic deficits are appreciated. Alert and oriented.  Face symmetric.  Tongue midline.  Cranial nerves II through XII intact. UE and LE strength 5/5 and symmetric. UE and LE SILT.  Skin: Skin is warm, dry and intact. No rash noted. Psychiatric: Mood and affect are appropriate for situation.  EKG  Personally reviewed and interpreted by myself.   Date: 02/02/20 Time: 9:17 Rate: 87 Rhythm: sinus Axis: normal Intervals: WNL No QTc prolongation, WPW, Brugada, or other arrhythmia No STEMI    Radiology   N/A   Procedures  Procedure(s) performed (including critical care):  Procedures   Initial Impression / Assessment and Plan / MDM / ED Course  29 y.o. female with hx of seizure, psychogenic nonepileptic seizures who presents to the ED for increased seizure frequency  Ddx: worsening epilepsy, psychogenic nonepileptic seizures (she does admit to increased stress recently), pregnancy, electrolyte abnormality, arrhythmia  Will plan for labs, urine studies, EKG and discuss w/ her Neurologist.   Clinical Course as of Feb 02 1600  Thu Feb 02, 2020  1357 Discussed case with Dr. Manuella Ghazi. Advised starting patient on second agent due to  increased number of reported seizures. Advises lamotrigine 25 mg BID x 1 week, then increase to 50 mg BID until she can be seen in clinic. Provide Rx for Ativan in case of break through. Will discuss w/ patient the possible side effects including drug rash and lowering efficacy of contraception.   [SM]    Clinical Course User Index [SM] Lilia Pro., MD   Discussed plan of care w/ patient and partner at bedside, who voice understanding and are in agreement. Discussed side effects as above. Labs unremarkable including electrolytes w/o actionable derangements, negative pregnancy, negative UDS. No seizure activity while observed in ED. Stable for discharge as planned above, advised outpatient Neurology follow up, and given return precautions.  _______________________________   As part of my medical decision making I have reviewed available labs, radiology tests, reviewed old records, obtained additional history from family, and discussed with consultants (Neurology, Dr. Manuella Ghazi).   Final Clinical Impression(s) / ED Diagnosis  Final diagnoses:  Seizure [facility])       Note:  This document was prepared using Dragon voice recognition software and may include unintentional dictation errors.   Lilia Pro., MD  02/02/20 1610  

## 2020-02-02 NOTE — Discharge Instructions (Addendum)
Thank you for letting us take care of you in the emergency department today.   Please continue to take any regular, prescribed medications.   New medications we have prescribed:  Lamotrigine (Lamictal), for the first week take 1 tablet (25 mg) twice a day for 1 week.  After this you can increase to 2 tablets (50 mg) twice a day until you follow-up with your neurologist.  As we discussed, potential side effects including developing a rash.  Should you develop a rash, please immediately stop taking the medication and contact your neurologist.  Additionally, being on Lamictal can decrease the efficacy of your birth control, we recommend a second method of birth control to prevent pregnancy while on Lamictal.  Please follow up with: Your neurology doctor to review your ER visit and follow up on your symptoms.    Please return to the ER for any new or worsening symptoms.

## 2020-02-02 NOTE — ED Notes (Signed)
Called lab,spoke to [place] said she could add on the labs.

## 2020-02-08 LAB — LEVETIRACETAM LEVEL: Levetiracetam Lvl: 39.9 ug/mL (ref 10.0–40.0)

## 2020-02-29 ENCOUNTER — Emergency Department
Admission: EM | Admit: 2020-02-29 | Discharge: 2020-02-29 | Disposition: A | Discharge status: Home / Self Care | Payer: Medicaid Other | Attending: Emergency Medicine | Admitting: Emergency Medicine

## 2020-02-29 ENCOUNTER — Other Ambulatory Visit: Payer: Self-pay

## 2020-02-29 DIAGNOSIS — Z79899 Other long term (current) drug therapy: Secondary | ICD-10-CM | POA: Diagnosis not present

## 2020-02-29 DIAGNOSIS — R569 Unspecified convulsions: Secondary | ICD-10-CM | POA: Diagnosis not present

## 2020-02-29 DIAGNOSIS — D680 Von Willebrand's disease: Secondary | ICD-10-CM | POA: Diagnosis not present

## 2020-02-29 LAB — BASIC METABOLIC PANEL
Anion gap: 5 (ref 5–15)
BUN: 9 mg/dL (ref 6–20)
CO2: 25 mmol/L (ref 22–32)
Calcium: 8.3 mg/dL — ABNORMAL LOW (ref 8.9–10.3)
Chloride: 107 mmol/L (ref 98–111)
Creatinine, Ser: 0.7 mg/dL (ref 0.44–1.00)
GFR calc Af Amer: >60 mL/min (ref >60)
GFR calc non Af Amer: >60 mL/min (ref >60)
Glucose, Bld: 89 mg/dL (ref 70–99)
Potassium: 3.9 mmol/L (ref 3.5–5.1)
Sodium: 137 mmol/L (ref 135–145)

## 2020-02-29 LAB — URINALYSIS, COMPLETE (UACMP) WITH MICROSCOPIC
Bacteria, UA: NONE SEEN
Bilirubin Urine: NEGATIVE
Glucose, UA: NEGATIVE mg/dL
Hgb urine dipstick: NEGATIVE
Ketones, ur: NEGATIVE mg/dL
Leukocytes,Ua: NEGATIVE
Nitrite: NEGATIVE
Protein, ur: NEGATIVE mg/dL
Specific Gravity, Urine: 1.001 — ABNORMAL LOW (ref 1.005–1.030)
pH: 6 (ref 5.0–8.0)

## 2020-02-29 LAB — CBC
HCT: 32.6 % — ABNORMAL LOW (ref 36.0–46.0)
Hemoglobin: 11.2 g/dL — ABNORMAL LOW (ref 12.0–15.0)
MCH: 28.4 pg (ref 26.0–34.0)
MCHC: 34.4 g/dL (ref 30.0–36.0)
MCV: 82.5 fL (ref 80.0–100.0)
Platelets: 264 10*3/uL (ref 150–400)
RBC: 3.95 MIL/uL (ref 3.87–5.11)
RDW: 14.2 % (ref 11.5–15.5)
WBC: 6.3 10*3/uL (ref 4.0–10.5)
nRBC: 0 % (ref 0.0–0.2)

## 2020-02-29 LAB — POCT PREGNANCY, URINE: Preg Test, Ur: NEGATIVE

## 2020-02-29 NOTE — ED Provider Notes (Signed)
[facility] Emergency Department Provider Note  Time seen: 12:54 PM  I have reviewed the triage vital signs and the nursing notes.   HISTORY  Chief Complaint Seizures   HPI Sandra Chavez is a 29 y.o. 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  According to the patient she was at work today when she had what she believes was a brief seizure.  Patient states her last seizure was approximately 4 weeks ago, sees Dr. Clelia Croft of neurology.  States she had been having more frequent seizures which is why she was recently started on Lamictal.  States her seizure frequency has decreased since starting Lamictal.  Patient denies any headache.  Largely negative review of systems.  Last menstrual period was approximately 4 weeks ago.   Past Medical History:  Diagnosis Date  . Seizures (HCC)   . Von Willebrand disease [facility])     Patient Active Problem List   Diagnosis Date Noted  . Attention deficit hyperactivity disorder (ADHD) 03/23/2019  . PTSD (post-traumatic stress disorder) 03/23/2019  . MDD (major depressive disorder), recurrent episode (HCC) 03/22/2019  . MDD (major depressive disorder), recurrent severe, without psychosis (HCC) 03/22/2019  . Seizures (HCC) 03/22/2019  . Psychogenic nonepileptic seizure 03/22/2019  . Depression     Past Surgical History:  Procedure Laterality Date  . BREAST BIOPSY    . LEEP      Prior to Admission medications   Medication Sig Start Date End Date Taking? Authorizing Provider  amphetamine-dextroamphetamine (ADDERALL) 5 MG tablet Take 2 tablets (10 mg total) by mouth 2 (two) times daily with a meal. 03/24/19   Clapacs, Jackquline Denmark, MD  FLUoxetine (PROZAC) 20 MG capsule Take 1 capsule (20 mg total) by mouth daily. 05/30/19   Clapacs, Jackquline Denmark, MD  lamoTRIgine (LAMICTAL) 25 MG tablet Take 1 tablet (25 mg) twice a day  for 1 week.  After this, increase to 2 tablets (50 mg) twice a day until you see your neurologist. 02/02/20   Miguel Aschoff., MD  levETIRAcetam (KEPPRA) 1000 MG tablet Take 1 tablet (1,000 mg total) by mouth 2 (two) times a day. 05/30/19   Clapacs, Jackquline Denmark, MD  LORazepam (ATIVAN) 1 MG tablet Take 1 tablet (1 mg total) by mouth as needed for up to 10 doses for seizure. 02/02/20 03/03/20  Miguel Aschoff., MD  prazosin (MINIPRESS) 1 MG capsule Take 1 capsule (1 mg total) by mouth at bedtime. 05/30/19   Clapacs, Jackquline Denmark, MD    No Known Allergies  No family history on file.  Social History Social History   Tobacco Use  . Smoking status: Never Smoker  . Smokeless tobacco: Never Used  Substance Use Topics  . Alcohol use: Never  . Drug use: Never    Review of Systems Constitutional: Negative for fever. Cardiovascular: Negative for chest pain. Respiratory: Negative for shortness of breath. Gastrointestinal: Negative for abdominal pain Musculoskeletal: Negative for musculoskeletal complaints Neurological: Negative for headache All other ROS negative  ____________________________________________   PHYSICAL EXAM:  VITAL SIGNS: ED Triage Vitals  Enc Vitals Group     BP 02/29/20 1106 111/68     Pulse Rate 02/29/20 1106 82     Resp 02/29/20 1106 18     Temp 02/29/20 1106 98.2 F (36.8 C)     Temp src --      SpO2 02/29/20 1106 100 %  Weight 02/29/20 1107 180 lb (81.6 kg)     Height 02/29/20 1107 5\' 7"  (1.702 m)     Head Circumference --      Peak Flow --      Pain Score 02/29/20 1107 0     Pain Loc --      Pain Edu? --      Excl. in [place]? --     Constitutional: Alert and oriented. Well appearing and in no distress. Eyes: Normal exam ENT      Head: Normocephalic and atraumatic.      Mouth/Throat: Mucous membranes are moist. Cardiovascular: Normal rate, regular rhythm.  Respiratory: Normal respiratory effort without tachypnea nor retractions. Breath sounds are clear   Gastrointestinal: Soft and nontender. No distention.   Musculoskeletal: Nontender with normal range of motion in all extremities.  Neurologic:  Normal speech and language. No gross focal neurologic deficits  Skin:  Skin is warm, dry and intact.  Psychiatric: Mood and affect are normal.   ____________________________________________   INITIAL IMPRESSION / ASSESSMENT AND PLAN / ED COURSE  Pertinent labs & imaging results that were available during my care of the patient were reviewed by me and considered in my medical decision making (see chart for details).   Patient presents to the emergency department after a seizure while at work.  History of nonepileptic seizure disorder.  Currently takes Lamictal and Keppra.  Currently the patient appears well with a reassuring physical exam.  Basic lab work is normal as well.  We will check a urine pregnancy test as a precaution.  Anticipate likely discharge home with neurology follow-up.  Patient agreeable to plan of care.  Patient's urinalysis is normal.  Pregnancy test is negative.  We will discharge with neurology follow-up.  Sandra Chavez was evaluated in Emergency Department on 02/29/2020 for the symptoms described in the history of present illness. She was evaluated in the context of the global COVID-19 pandemic, which necessitated consideration that the patient might be at risk for infection with the SARS-CoV-2 virus that causes COVID-19. Institutional protocols and algorithms that pertain to the evaluation of patients at risk for COVID-19 are in a state of rapid change based on information released by regulatory bodies including the CDC and federal and state organizations. These policies and algorithms were followed during the patient's care in the ED.  ____________________________________________   FINAL CLINICAL IMPRESSION(S) / ED DIAGNOSES  Nonepileptiform seizure   Harvest Dark, MD 02/29/20 1342

## 2020-02-29 NOTE — ED Notes (Signed)
Pt assisted to bathroom. Pt ambulatory with steady gait and stand by assistance from this RN.

## 2020-02-29 NOTE — ED Triage Notes (Signed)
First Nurse Note:  Arrives via [facility] for c/o seizure.  Per EMS report, patient has history of pseudoseizure.  Patient arrives AAOx3.  Skin warm and dry. NAD

## 2020-02-29 NOTE — ED Triage Notes (Signed)
Pt comes via [facility] from home with c/o seizures. Pt states she was at work and went to get some drink. Pt states she just didn't feel well. Pt states she walked back into her room where she was working and that is last she remembers.  Pt states hx of pseudoseizure. Pt states she take medication as prescribed.  Pt states headache

## 2021-11-15 ENCOUNTER — Other Ambulatory Visit: Payer: Self-pay

## 2021-11-15 DIAGNOSIS — Z20822 Contact with and (suspected) exposure to covid-19: Secondary | ICD-10-CM | POA: Diagnosis not present

## 2021-11-15 DIAGNOSIS — Z5321 Procedure and treatment not carried out due to patient leaving prior to being seen by health care provider: Secondary | ICD-10-CM | POA: Insufficient documentation

## 2021-11-15 DIAGNOSIS — R42 Dizziness and giddiness: Secondary | ICD-10-CM | POA: Diagnosis not present

## 2021-11-15 DIAGNOSIS — R1110 Vomiting, unspecified: Secondary | ICD-10-CM | POA: Diagnosis present

## 2021-11-15 DIAGNOSIS — R197 Diarrhea, unspecified: Secondary | ICD-10-CM | POA: Diagnosis not present

## 2021-11-15 LAB — POC URINE PREG, ED: Preg Test, Ur: NEGATIVE

## 2021-11-15 MED ORDER — ONDANSETRON 4 MG PO TBDP
4.0000 mg | ORAL_TABLET | Freq: Once | ORAL | Status: AC
  Administered 2021-11-15: 4 mg via ORAL
  Filled 2021-11-15: qty 1

## 2021-11-15 NOTE — ED Triage Notes (Signed)
Pt presents to ER c/o emesis and diarrhea that started today.  Pt also c/o associated light headed and soreness.  Pt A&O x4 at this time in NAD.

## 2021-11-16 ENCOUNTER — Emergency Department
Admission: EM | Admit: 2021-11-16 | Discharge: 2021-11-16 | Disposition: A | Discharge status: Home / Self Care | Payer: Medicaid Other | Attending: Emergency Medicine | Admitting: Emergency Medicine

## 2021-11-16 LAB — COMPREHENSIVE METABOLIC PANEL
ALT: 17 U/L (ref 0–44)
AST: 29 U/L (ref 15–41)
Albumin: 4.3 g/dL (ref 3.5–5.0)
Alkaline Phosphatase: 74 U/L (ref 38–126)
Anion gap: 8 (ref 5–15)
BUN: 10 mg/dL (ref 6–20)
CO2: 23 mmol/L (ref 22–32)
Calcium: 8.7 mg/dL — ABNORMAL LOW (ref 8.9–10.3)
Chloride: 104 mmol/L (ref 98–111)
Creatinine, Ser: 0.86 mg/dL (ref 0.44–1.00)
GFR, Estimated: >60 mL/min (ref >60)
Glucose, Bld: 127 mg/dL — ABNORMAL HIGH (ref 70–99)
Potassium: 3.8 mmol/L (ref 3.5–5.1)
Sodium: 135 mmol/L (ref 135–145)
Total Bilirubin: 1.2 mg/dL (ref 0.3–1.2)
Total Protein: 8.1 g/dL (ref 6.5–8.1)

## 2021-11-16 LAB — RESP PANEL BY RT-PCR (FLU A&B, COVID) ARPGX2
Influenza A by PCR: NEGATIVE
Influenza B by PCR: NEGATIVE
SARS Coronavirus 2 by RT PCR: NEGATIVE

## 2021-11-16 LAB — CBC
HCT: 37.1 % (ref 36.0–46.0)
Hemoglobin: 13.1 g/dL (ref 12.0–15.0)
MCH: 28.9 pg (ref 26.0–34.0)
MCHC: 35.3 g/dL (ref 30.0–36.0)
MCV: 81.7 fL (ref 80.0–100.0)
Platelets: 331 10*3/uL (ref 150–400)
RBC: 4.54 MIL/uL (ref 3.87–5.11)
RDW: 14 % (ref 11.5–15.5)
WBC: 8.7 10*3/uL (ref 4.0–10.5)
nRBC: 0 % (ref 0.0–0.2)

## 2021-11-16 LAB — URINALYSIS, ROUTINE W REFLEX MICROSCOPIC
RBC / HPF: >50 RBC/hpf — ABNORMAL HIGH (ref 0–5)
Specific Gravity, Urine: 1.015 (ref 1.005–1.030)

## 2021-11-16 LAB — LIPASE, BLOOD: Lipase: 25 U/L (ref 11–51)

## 2022-09-25 ENCOUNTER — Emergency Department
Admission: EM | Admit: 2022-09-25 | Discharge: 2022-09-25 | Disposition: A | Discharge status: Home / Self Care | Payer: Medicaid Other | Attending: Emergency Medicine | Admitting: Emergency Medicine

## 2022-09-25 ENCOUNTER — Other Ambulatory Visit: Payer: Self-pay

## 2022-09-25 ENCOUNTER — Emergency Department: Payer: Medicaid Other

## 2022-09-25 DIAGNOSIS — R519 Headache, unspecified: Secondary | ICD-10-CM | POA: Diagnosis present

## 2022-09-25 DIAGNOSIS — G40909 Epilepsy, unspecified, not intractable, without status epilepticus: Secondary | ICD-10-CM | POA: Insufficient documentation

## 2022-09-25 DIAGNOSIS — G43809 Other migraine, not intractable, without status migrainosus: Secondary | ICD-10-CM | POA: Diagnosis not present

## 2022-09-25 DIAGNOSIS — R569 Unspecified convulsions: Secondary | ICD-10-CM

## 2022-09-25 LAB — BASIC METABOLIC PANEL
Anion gap: 3 — ABNORMAL LOW (ref 5–15)
BUN: 10 mg/dL (ref 6–20)
CO2: 26 mmol/L (ref 22–32)
Calcium: 8.7 mg/dL — ABNORMAL LOW (ref 8.9–10.3)
Chloride: 109 mmol/L (ref 98–111)
Creatinine, Ser: 0.67 mg/dL (ref 0.44–1.00)
GFR, Estimated: >60 mL/min (ref >60)
Glucose, Bld: 101 mg/dL — ABNORMAL HIGH (ref 70–99)
Potassium: 3.6 mmol/L (ref 3.5–5.1)
Sodium: 138 mmol/L (ref 135–145)

## 2022-09-25 LAB — CBC WITH DIFFERENTIAL/PLATELET
Abs Immature Granulocytes: 0.02 10*3/uL (ref 0.00–0.07)
Basophils Absolute: 0 10*3/uL (ref 0.0–0.1)
Basophils Relative: 0 %
Eosinophils Absolute: 0.3 10*3/uL (ref 0.0–0.5)
Eosinophils Relative: 4 %
HCT: 34.3 % — ABNORMAL LOW (ref 36.0–46.0)
Hemoglobin: 12.1 g/dL (ref 12.0–15.0)
Immature Granulocytes: 0 %
Lymphocytes Relative: 19 %
Lymphs Abs: 1.6 10*3/uL (ref 0.7–4.0)
MCH: 28.9 pg (ref 26.0–34.0)
MCHC: 35.3 g/dL (ref 30.0–36.0)
MCV: 82.1 fL (ref 80.0–100.0)
Monocytes Absolute: 0.5 10*3/uL (ref 0.1–1.0)
Monocytes Relative: 6 %
Neutro Abs: 5.9 10*3/uL (ref 1.7–7.7)
Neutrophils Relative %: 71 %
Platelets: 310 10*3/uL (ref 150–400)
RBC: 4.18 MIL/uL (ref 3.87–5.11)
RDW: 14.2 % (ref 11.5–15.5)
WBC: 8.3 10*3/uL (ref 4.0–10.5)
nRBC: 0 % (ref 0.0–0.2)

## 2022-09-25 LAB — POC URINE PREG, ED: Preg Test, Ur: NEGATIVE

## 2022-09-25 MED ORDER — SODIUM CHLORIDE 0.9 % IV BOLUS
1000.0000 mL | Freq: Once | INTRAVENOUS | Status: AC
  Administered 2022-09-25: 1000 mL via INTRAVENOUS

## 2022-09-25 MED ORDER — ONDANSETRON HCL 4 MG/2ML IJ SOLN
4.0000 mg | Freq: Once | INTRAMUSCULAR | Status: AC
  Administered 2022-09-25: 4 mg via INTRAVENOUS
  Filled 2022-09-25: qty 2

## 2022-09-25 MED ORDER — DEXAMETHASONE 6 MG PO TABS
10.0000 mg | ORAL_TABLET | Freq: Once | ORAL | Status: AC
  Administered 2022-09-25: 10 mg via ORAL
  Filled 2022-09-25: qty 1

## 2022-09-25 MED ORDER — KETOROLAC TROMETHAMINE 30 MG/ML IJ SOLN
30.0000 mg | Freq: Once | INTRAMUSCULAR | Status: AC
  Administered 2022-09-25: 30 mg via INTRAVENOUS
  Filled 2022-09-25: qty 1

## 2022-09-25 MED ORDER — DEXAMETHASONE SODIUM PHOSPHATE 10 MG/ML IJ SOLN: 10.0000 mg | Freq: Once | INTRAMUSCULAR | Status: DC

## 2022-09-25 MED ORDER — METOCLOPRAMIDE HCL 5 MG/ML IJ SOLN
5.0000 mg | Freq: Once | INTRAMUSCULAR | Status: AC
  Administered 2022-09-25: 5 mg via INTRAVENOUS
  Filled 2022-09-25: qty 2

## 2022-09-25 MED ORDER — DIPHENHYDRAMINE HCL 50 MG/ML IJ SOLN
12.5000 mg | Freq: Once | INTRAMUSCULAR | Status: AC
Start: 2022-09-25 — End: 2022-09-25
  Administered 2022-09-25: 12.5 mg via INTRAVENOUS
  Filled 2022-09-25: qty 1

## 2022-09-25 NOTE — Discharge Instructions (Addendum)
Pain control:  Ibuprofen (motrin/aleve/advil) - You can take 3-4 tablets (600-800 mg) every 6 hours as needed for pain/fever.  Acetaminophen (tylenol) - You can take 2 extra strength tablets (1000 mg) every 6 hours as needed for pain/fever.  You can alternate these medications or take them together.  Make sure you eat food/drink water when taking these medications. 

## 2022-09-25 NOTE — ED Provider Notes (Signed)
[facility] Provider Note    Event Date/Time   First MD Initiated Contact with Patient 09/25/22 1003     (approximate)   History   Seizures   HPI  Sandra Chavez is a 31 y.o. female past medical history significant for nonepileptic seizures, migraine headaches, who presents to the emergency department with a headache and having a seizure.  Patient states that she normally gets migraine headaches around her menstrual cycle and believes she is about to start her period.  States that she sometimes gets seizures whenever she has bad migraine headaches.  Recently was discontinued from her [place].  Today was having a headache and went to the bathroom to throw up which she states is her normal.  Her boyfriend witnessed seizure-like activity.  Lasted for approximately 30 seconds and now she returned back to her normal.  Continues to complain of a headache which she states is her migraine headaches but feels worse.  No sudden onset of headache.  Progressively worsening since this morning.  Endorses photophobia with nausea and vomiting.  Denies any extremity numbness or weakness.  No recent falls or head trauma.  Denies fever or chills.     Physical Exam   Triage Vital Signs: ED Triage Vitals  Enc Vitals Group     BP 09/25/22 1007 117/77     Pulse Rate 09/25/22 1007 86     Resp 09/25/22 1007 18     Temp --      Temp src --      SpO2 09/25/22 1005 99 %     Weight 09/25/22 1008 209 lb 10.5 oz (95.1 kg)     Height 09/25/22 1005 5\' 7"  (1.702 m)     Head Circumference --      Peak Flow --      Pain Score 09/25/22 1001 8     Pain Loc --      Pain Edu? --      Excl. in [place]? --     Most recent vital signs: Vitals:   09/25/22 1005 09/25/22 1007  BP:  117/77  Pulse:  86  Resp:  18  SpO2: 99%     Physical Exam Constitutional:      Appearance: She is well-developed.  HENT:     Head: Atraumatic.  Eyes:     Conjunctiva/sclera: Conjunctivae normal.   Cardiovascular:     Rate and Rhythm: Regular rhythm.  Pulmonary:     Effort: No respiratory distress.  Abdominal:     General: There is no distension.  Musculoskeletal:        General: Normal range of motion.     Cervical back: Normal range of motion.  Skin:    General: Skin is warm.  Neurological:     Mental Status: She is alert. Mental status is at baseline.     Comments: Cranial nerves grossly intact.  5/5 strength bilateral upper and lower extremities.  Sensation intact in upper and lower extremities.  Normal gait.          IMPRESSION / MDM / ASSESSMENT AND PLAN / ED COURSE  I reviewed the triage vital signs and the nursing notes.  Differential diagnosis including nonepileptic seizures, migraine headache, intracranial hemorrhage.  Clinical picture is not consistent with cerebral venous thrombosis or meningitis.  Pregnancy test negative.  Plan for lab work.  Will obtain CT scan of the head, is within 6 hours of severe headache.  EKG  I, Nathaniel Man, the  attending physician, personally viewed and interpreted this ECG.   Rate: Normal  Rhythm: Normal sinus  Axis: Normal  Intervals: Normal  ST&T Change: None  No tachycardic or bradycardic dysrhythmias while on cardiac telemetry.  RADIOLOGY I independently reviewed imaging, my interpretation of imaging: CT scan of the head showed no signs of intracranial hemorrhage or infarction      ED Results / Procedures / Treatments   Labs (all labs ordered are listed, but only abnormal results are displayed) Labs interpreted as -     Labs Reviewed  CBC WITH DIFFERENTIAL/PLATELET - Abnormal; Notable for the following components:      Result Value   HCT 34.3 (*)    All other components within normal limits  BASIC METABOLIC PANEL - Abnormal; Notable for the following components:   Glucose, Bld 101 (*)    Calcium 8.7 (*)    Anion gap 3 (*)    All other components within normal limits  POC URINE PREG, ED     Pregnancy test is negative.  No significant electrolyte abnormalities.  No signs of an infectious process.  Treated with migraine cocktail  11:22 AM On reevaluation patient states that her headache is feeling better.  Tolerating p.o.  Given a dose of Decadron to prevent rebound headache.  Discussed follow-up with primary care physician and her neurologist.  Given return precautions.   PROCEDURES:  Critical Care performed: No  Procedures  Patient's presentation is most consistent with acute presentation with potential threat to life or bodily function.   MEDICATIONS ORDERED IN ED: Medications  ondansetron (ZOFRAN) injection 4 mg (4 mg Intravenous Given 09/25/22 1016)  ketorolac (TORADOL) 30 MG/ML injection 30 mg (30 mg Intravenous Given 09/25/22 1100)  metoCLOPramide (REGLAN) injection 5 mg (5 mg Intravenous Given 09/25/22 1105)  diphenhydrAMINE (BENADRYL) injection 12.5 mg (12.5 mg Intravenous Given 09/25/22 1104)  sodium chloride 0.9 % bolus 1,000 mL (1,000 mLs Intravenous New Bag/Given 09/25/22 1106)    FINAL CLINICAL IMPRESSION(S) / ED DIAGNOSES   Final diagnoses:  Seizure-like activity (HCC)  Other migraine without status migrainosus, not intractable     Rx / DC Orders   ED Discharge Orders     None        Note:  This document was prepared using Dragon voice recognition software and may include unintentional dictation errors.   Corena Herter, MD 09/25/22 1122

## 2022-09-25 NOTE — ED Triage Notes (Signed)
Pt arrives via EMS for seizure like activity . 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 States started with headache yesterday with no relief. Family witnessed 30 seconds seizure like activity this AM. Pt states weaned off keppra a year ago. Pt alert and oriented in NAD on arrival.

## 2023-02-16 ENCOUNTER — Other Ambulatory Visit: Payer: Self-pay | Admitting: Student

## 2023-02-16 DIAGNOSIS — R569 Unspecified convulsions: Secondary | ICD-10-CM

## 2023-02-22 ENCOUNTER — Ambulatory Visit
Admission: RE | Admit: 2023-02-22 | Discharge: 2023-02-22 | Disposition: A | Discharge status: Home / Self Care | Payer: Medicaid Other | Source: Ambulatory Visit | Attending: Student | Admitting: Student

## 2023-02-22 DIAGNOSIS — R569 Unspecified convulsions: Secondary | ICD-10-CM | POA: Diagnosis present

## 2023-02-22 MED ORDER — GADOBUTROL 1 MMOL/ML IV SOLN
10.0000 mL | Freq: Once | INTRAVENOUS | Status: AC | PRN
  Administered 2023-02-22: 10 mL via INTRAVENOUS

## 2023-02-22 MED ORDER — GADOBUTROL 1 MMOL/ML IV SOLN: 9.0000 mL | Freq: Once | INTRAVENOUS | Status: DC | PRN

## 2023-04-09 DIAGNOSIS — F331 Major depressive disorder, recurrent, moderate: Secondary | ICD-10-CM | POA: Diagnosis not present

## 2023-04-13 ENCOUNTER — Emergency Department
Admission: EM | Admit: 2023-04-13 | Discharge: 2023-04-13 | Discharge status: Against Medical Advice | Payer: Commercial Managed Care - PPO

## 2023-04-16 DIAGNOSIS — F331 Major depressive disorder, recurrent, moderate: Secondary | ICD-10-CM | POA: Diagnosis not present

## 2023-04-28 DIAGNOSIS — F331 Major depressive disorder, recurrent, moderate: Secondary | ICD-10-CM | POA: Diagnosis not present

## 2023-04-30 DIAGNOSIS — F331 Major depressive disorder, recurrent, moderate: Secondary | ICD-10-CM | POA: Diagnosis not present

## 2023-05-14 DIAGNOSIS — F331 Major depressive disorder, recurrent, moderate: Secondary | ICD-10-CM | POA: Diagnosis not present

## 2023-05-28 DIAGNOSIS — F331 Major depressive disorder, recurrent, moderate: Secondary | ICD-10-CM | POA: Diagnosis not present

## 2023-06-11 DIAGNOSIS — F331 Major depressive disorder, recurrent, moderate: Secondary | ICD-10-CM | POA: Diagnosis not present

## 2023-06-24 DIAGNOSIS — H52223 Regular astigmatism, bilateral: Secondary | ICD-10-CM | POA: Diagnosis not present

## 2023-06-24 DIAGNOSIS — H50012 Monocular esotropia, left eye: Secondary | ICD-10-CM | POA: Diagnosis not present

## 2023-07-24 ENCOUNTER — Ambulatory Visit
Admission: EM | Admit: 2023-07-24 | Discharge: 2023-07-24 | Disposition: A | Discharge status: Home / Self Care | Payer: Commercial Managed Care - PPO | Attending: Emergency Medicine | Admitting: Emergency Medicine

## 2023-07-24 ENCOUNTER — Ambulatory Visit (INDEPENDENT_AMBULATORY_CARE_PROVIDER_SITE_OTHER): Payer: Commercial Managed Care - PPO

## 2023-07-24 DIAGNOSIS — M25579 Pain in unspecified ankle and joints of unspecified foot: Secondary | ICD-10-CM | POA: Insufficient documentation

## 2023-07-24 DIAGNOSIS — M25572 Pain in left ankle and joints of left foot: Secondary | ICD-10-CM | POA: Diagnosis not present

## 2023-07-24 DIAGNOSIS — S9002XA Contusion of left ankle, initial encounter: Secondary | ICD-10-CM | POA: Diagnosis not present

## 2023-07-24 MED ORDER — IBUPROFEN 600 MG PO TABS: 600.0000 mg | ORAL_TABLET | Freq: Four times a day (QID) | ORAL | 0 refills | Status: AC | PRN

## 2023-07-24 NOTE — Discharge Instructions (Addendum)
Take the ibuprofen as prescribed.  Rest and elevate your ankle.  Apply ice packs 2-3 times a day for up to 20 minutes each.  Wear the walking boot as directed.    Follow up with an orthopedist.

## 2023-07-24 NOTE — ED Triage Notes (Signed)
Patient to Urgent Care with complaints of left sided ankle pain.  Reports she was walking down her outside steps last night. Fell down 2 steps. No LOC or hitting head. Pain in her ankle when she got up and when putting weight on her ankle.

## 2023-07-24 NOTE — ED Provider Notes (Signed)
Renaldo Fiddler    CSN: [id number] Arrival date & time: 07/24/23  1206      History   Chief Complaint Chief Complaint  Patient presents with   Fall    HPI Sandra Chavez is a 32 y.o. female.  Patient presents with left ankle pain, swelling, bruising since last night when she accidentally fell down 2 steps.  No head injury or loss of consciousness.  The ankle pain is worse with weightbearing and ambulation.  No OTC medications taken today.  No wounds, redness, numbness, weakness, or other symptoms. Her medical history includes psychogenic nonepileptic seizures, migraine headaches, PTSD, anxiety, depression.  The history is provided by the patient and medical records.    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      Home Medications    Prior to Admission medications   Medication Sig Start Date End Date Taking? Authorizing Provider  ibuprofen (ADVIL) 600 MG tablet Take 1 tablet (600 mg total) by mouth every 6 (six) hours as needed. 07/24/23  Yes Mickie Bail, NP  amphetamine-dextroamphetamine (ADDERALL) 5 MG tablet Take 2 tablets (10 mg total) by mouth 2 (two) times daily with a meal. Patient not taking: Reported on 07/24/2023 03/24/19   Clapacs, Jackquline Denmark, MD  FLUoxetine (PROZAC) 20 MG capsule Take 1 capsule (20 mg total) by mouth  daily. Patient taking differently: Take 40 mg by mouth daily. 05/30/19   Clapacs, Jackquline Denmark, MD  lamoTRIgine (LAMICTAL) 25 MG tablet Take 1 tablet (25 mg) twice a day for 1 week.  After this, increase to 2 tablets (50 mg) twice a day until you see your neurologist. Patient not taking: Reported on 07/24/2023 02/02/20   Miguel Aschoff., MD  levETIRAcetam (KEPPRA) 1000 MG tablet Take 1 tablet (1,000 mg total) by mouth 2 (two) times a day. Patient not taking: Reported on 07/24/2023 05/30/19   Clapacs, Jackquline Denmark, MD  prazosin (MINIPRESS) 1 MG capsule Take 1 capsule (1 mg total) by mouth at bedtime. Patient not taking: Reported on 07/24/2023 05/30/19   Clapacs, Jackquline Denmark, MD  topiramate (TOPAMAX) 25 MG tablet Take 50 mg by mouth 2 (two) times daily.    [provider]  VYVANSE 30 MG capsule Take 30 mg by mouth every morning.    [provider]    Family History History reviewed. No pertinent family history.  Social History Social History   Tobacco Use   Smoking status: Never   Smokeless tobacco: Never  Vaping Use   Vaping status: Never Used  Substance Use Topics   Alcohol use: Never   Drug use: Never     Allergies   Patient has no known allergies.   Review of Systems Review of Systems  Constitutional:  Negative for chills and fever.  Musculoskeletal:  Positive for arthralgias,  gait problem and joint swelling.  Skin:  Positive for color change. Negative for wound.  Neurological:  Negative for weakness and numbness.     Physical Exam Triage Vital Signs ED Triage Vitals [07/24/23 1224]  Encounter Vitals Group     BP      Systolic BP Percentile      Diastolic BP Percentile      Pulse Rate 100     Resp 18     Temp 97.7 F (36.5 C)     Temp src      SpO2 97 %     Weight      Height      Head Circumference      Peak Flow      Pain Score      Pain Loc      Pain Education      Exclude from Growth Chart    No data found.  Updated Vital Signs BP 117/76   Pulse 100    Temp 97.7 F (36.5 C)   Resp 18   SpO2 97%   Visual Acuity Right Eye Distance:   Left Eye Distance:   Bilateral Distance:    Right Eye Near:   Left Eye Near:    Bilateral Near:     Physical Exam Constitutional:      General: She is not in acute distress. HENT:     Mouth/Throat:     Mouth: Mucous membranes are moist.  Cardiovascular:     Rate and Rhythm: Normal rate and regular rhythm.  Pulmonary:     Effort: Pulmonary effort is normal. No respiratory distress.  Musculoskeletal:        General: Swelling and tenderness present. No deformity. Normal range of motion.       Feet:  Skin:    General: Skin is warm and dry.     Capillary Refill: Capillary refill takes less than 2 seconds.     Findings: Bruising present. No erythema, lesion or rash.  Neurological:     General: No focal deficit present.     Mental Status: She is alert and oriented to person, place, and time.     Sensory: No sensory deficit.     Motor: No weakness.     Gait: Gait abnormal.  Psychiatric:        Mood and Affect: Mood normal.        Behavior: Behavior normal.      UC Treatments / Results  Labs (all labs ordered are listed, but only abnormal results are displayed) Labs Reviewed - No data to display  EKG   Radiology DG Ankle Complete Left  Result Date: 07/24/2023 CLINICAL DATA:  pain after injury EXAM: LEFT ANKLE COMPLETE - 3+ VIEW COMPARISON:  None Available. FINDINGS: No acute fracture or dislocation. Joint spaces and alignment are maintained. No area of erosion or osseous destruction. No unexpected radiopaque foreign body. Soft tissue edema. IMPRESSION: Soft tissue edema without acute fracture or dislocation. Electronically Signed   By: Meda Klinefelter M.D.   On: 07/24/2023 13:06    Procedures Procedures (including critical care time)  Medications Ordered in UC Medications - No data to display  Initial Impression / Assessment and Plan / UC Course  I have reviewed the triage  vital signs and the nursing notes.  Pertinent labs & imaging results that were available during my care of the patient were reviewed by me and considered in my medical decision making (see chart for details).  Left ankle pain, left ankle contusion.  X-ray negative for fracture or dislocation.  Treating with walking boot, ibuprofen, rest, elevation, ice packs.  Instructed patient to follow-up with an orthopedist.  Contact information for on-call Ortho provided.  Education provided on ankle pain.  She agrees to plan of care.    Final Clinical Impressions(s) / UC Diagnoses   Final diagnoses:  Acute left ankle pain  Contusion of left ankle, initial encounter     Discharge Instructions      Take the ibuprofen as prescribed.  Rest and elevate your ankle.  Apply ice packs 2-3 times a day for up to 20 minutes each.  Wear the walking boot as directed.    Follow up with an orthopedist.        ED Prescriptions     Medication Sig Dispense Auth. Provider   ibuprofen (ADVIL) 600 MG tablet Take 1 tablet (600 mg total) by mouth every 6 (six) hours as needed. 10 tablet Mickie Bail, NP      PDMP not reviewed this encounter.   Mickie Bail, NP 07/24/23 1322

## 2023-08-04 DIAGNOSIS — G43019 Migraine without aura, intractable, without status migrainosus: Secondary | ICD-10-CM | POA: Diagnosis not present

## 2023-08-04 DIAGNOSIS — E538 Deficiency of other specified B group vitamins: Secondary | ICD-10-CM | POA: Diagnosis not present

## 2023-08-04 DIAGNOSIS — R569 Unspecified convulsions: Secondary | ICD-10-CM | POA: Diagnosis not present

## 2023-08-04 DIAGNOSIS — R000 Tachycardia, unspecified: Secondary | ICD-10-CM | POA: Diagnosis not present

## 2023-08-04 DIAGNOSIS — E569 Vitamin deficiency, unspecified: Secondary | ICD-10-CM | POA: Diagnosis not present

## 2023-08-06 DIAGNOSIS — S90811A Abrasion, right foot, initial encounter: Secondary | ICD-10-CM | POA: Diagnosis not present

## 2023-08-06 DIAGNOSIS — S93412A Sprain of calcaneofibular ligament of left ankle, initial encounter: Secondary | ICD-10-CM | POA: Diagnosis not present

## 2023-08-06 DIAGNOSIS — S93432A Sprain of tibiofibular ligament of left ankle, initial encounter: Secondary | ICD-10-CM | POA: Diagnosis not present

## 2023-09-21 ENCOUNTER — Inpatient Hospital Stay (HOSPITAL_COMMUNITY)
Admission: RE | Admit: 2023-09-21 | Payer: Commercial Managed Care - PPO | Source: Ambulatory Visit | Admitting: Neurology

## 2023-09-21 ENCOUNTER — Encounter (HOSPITAL_COMMUNITY): Payer: Commercial Managed Care - PPO

## 2024-08-17 ENCOUNTER — Encounter: Payer: Self-pay | Admitting: Emergency Medicine

## 2024-08-17 ENCOUNTER — Telehealth: Payer: Self-pay | Admitting: Emergency Medicine

## 2024-08-17 ENCOUNTER — Ambulatory Visit: Admission: EM | Admit: 2024-08-17 | Discharge: 2024-08-17 | Disposition: A | Discharge status: Home / Self Care

## 2024-08-17 DIAGNOSIS — L02414 Cutaneous abscess of left upper limb: Secondary | ICD-10-CM | POA: Diagnosis not present

## 2024-08-17 MED ORDER — CEPHALEXIN 500 MG PO CAPS: 500.0000 mg | ORAL_CAPSULE | Freq: Two times a day (BID) | ORAL | 0 refills | Status: AC

## 2024-08-17 NOTE — ED Provider Notes (Signed)
 Sandra Chavez    CSN: [id number] Arrival date & time: 08/17/24  1217      History   Chief Complaint Chief Complaint  Patient presents with   Abscess    HPI Sandra Chavez is a 33 y.o. female.   Patient presents for evaluation of a lump present to the left elbow beginning 7 days ago.  Has increased in size, only tender when touched.  has noticed a small amount of drainage.  Denies fever.  Has not attempted treatment.  Denies injury or trauma to the area.   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      Home Medications    Prior to Admission medications   Medication Sig Start Date End Date Taking? Authorizing Provider  amphetamine -dextroamphetamine  (ADDERALL XR) 10 MG 24 hr capsule Take 10 mg by mouth every morning.    [provider]  amphetamine -dextroamphetamine  (ADDERALL) 5 MG tablet Take 2 tablets (10 mg total) by mouth 2 (two) times daily with a meal. Patient not taking: Reported on 07/24/2023 03/24/19   Clapacs, Norleen DASEN, MD  FLUoxetine  (PROZAC ) 20 MG capsule Take 1 capsule (20 mg total) by mouth daily. 05/30/19   Clapacs, Norleen DASEN, MD  ibuprofen  (ADVIL ) 600 MG tablet Take 1 tablet (600 mg total) by mouth every 6 (six) hours as needed. 07/24/23   Corlis Burnard DEL, NP  lamoTRIgine  (LAMICTAL ) 25 MG tablet Take 1 tablet (25 mg) twice a  day for 1 week.  After this, increase to 2 tablets (50 mg) twice a day until you see your neurologist. Patient not taking: Reported on 07/24/2023 02/02/20   Vila Lauraine CROME., MD  levETIRAcetam  (KEPPRA ) 1000 MG tablet Take 1 tablet (1,000 mg total) by mouth 2 (two) times a day. Patient not taking: Reported on 07/24/2023 05/30/19   Clapacs, Norleen DASEN, MD  prazosin  (MINIPRESS ) 1 MG capsule Take 1 capsule (1 mg total) by mouth at bedtime. Patient not taking: Reported on 07/24/2023 05/30/19   Clapacs, Norleen DASEN, MD  topiramate (TOPAMAX) 25 MG tablet Take 50 mg by mouth 2 (two) times daily.    [provider]  VYVANSE 30 MG capsule Take 30 mg by mouth every morning.    [provider]    Family History History reviewed. No pertinent family history.  Social History Social History   Tobacco Use   Smoking status: Never   Smokeless tobacco: Never  Vaping Use   Vaping status: Never Used  Substance Use Topics   Alcohol use: Never   Drug use: Never     Allergies   Patient has no known allergies.   Review of Systems Review of Systems   Physical Exam Triage Vital Signs ED Triage Vitals  Encounter Vitals Group     BP 08/17/24 1246 108/69     Girls  Systolic BP Percentile --      Girls Diastolic BP Percentile --      Boys Systolic BP Percentile --      Boys Diastolic BP Percentile --      Pulse Rate 08/17/24 1246 (!) 108     Resp 08/17/24 1246 18     Temp 08/17/24 1246 98.8 F (37.1 C)     Temp Source 08/17/24 1246 Oral     SpO2 08/17/24 1246 98 %     Weight --      Height --      Head Circumference --      Peak Flow --      Pain Score 08/17/24 1241 0     Pain Loc --      Pain Education --      Exclude from Growth Chart --    No data found.  Updated Vital Signs BP 108/69 (BP Location: Right Arm)   Pulse (!) 108   Temp 98.8 F (37.1 C) (Oral)   Resp 18   SpO2 98%   Visual Acuity Right Eye Distance:   Left Eye Distance:   Bilateral Distance:    Right Eye  Near:   Left Eye Near:    Bilateral Near:     Physical Exam Constitutional:      Appearance: Normal appearance.  Eyes:     Extraocular Movements: Extraocular movements intact.  Pulmonary:     Effort: Pulmonary effort is normal.  Skin:    Comments: 2 x 3 erythematous and tender abscess present to the lateral aspect of the left forearm  Neurological:     Mental Status: She is alert and oriented to person, place, and time. Mental status is at baseline.      UC Treatments / Results  Labs (all labs ordered are listed, but only abnormal results are displayed) Labs Reviewed - No data to display  EKG   Radiology No results found.  Procedures Incision and Drainage  Date/Time: 08/17/2024 3:51 PM  Performed by: Teresa Shelba SAUNDERS, NP Authorized by: Teresa Shelba SAUNDERS, NP   Consent:    Consent obtained:  Verbal   Consent given by:  Patient   Risks, benefits, and alternatives were discussed: yes     Risks discussed:  Incomplete drainage Universal protocol:    Procedure explained and questions answered to patient or proxy's satisfaction: yes     Patient identity confirmed:  Verbally with patient Location:    Type:  Abscess   Size:  2x3   Location: left forearm. Pre-procedure details:    Skin preparation:  Chlorhexidine with alcohol Anesthesia:    Anesthesia method:  Local infiltration   Local anesthetic:  Lidocaine 1% w/o epi Procedure type:    Complexity:  Simple Procedure details:    Incision types:  Single straight   Drainage:  Bloody and purulent   Drainage amount:  Copious   Wound treatment:  Wound left open   Packing materials:  None Post-procedure details:    Procedure completion:  Tolerated  (including critical care time)  Medications Ordered in UC Medications - No data to display  Initial Impression / Assessment and Plan / UC Course  I have reviewed the triage vital signs and the nursing notes.  Pertinent labs & imaging results that were available  during my care of the patient were reviewed by me and considered in my medical decision making (see chart for details).  Abscess of left forearm  I&D completed, tolerated well, prescribed  cephalexin discussed administration recommended daily cleansing and over-the-counter analgesics for pain management advised follow-up for any concerns regarding healing Final Clinical Impressions(s) / UC Diagnoses   Final diagnoses:  Abscess of left forearm     Discharge Instructions      Wound has been opened and drained here in the clinic  Take cephalexin twice daily for 5 days  Hold warm-hot compresses to affected area at least 4 times a day, this helps to facilitate draining, the more the better  Please return for evaluation for increased swelling, increased tenderness or pain, non healing site, non draining site, you begin to have fever or chills   We reviewed the etiology of recurrent abscesses of skin.  Skin abscesses are collections of pus within the dermis and deeper skin tissues. Skin abscesses manifest as painful, tender, fluctuant, and erythematous nodules, frequently surmounted by a pustule and surrounded by a rim of erythematous swelling.  Spontaneous drainage of purulent material may occur.  Fever can occur on occasion.    -Skin abscesses can develop in healthy individuals with no predisposing conditions other than skin or nasal carriage of Staphylococcus aureus.  Individuals in close contact with others who have active infection with skin abscesses are at increased risk which is likely to explain why twin brother has similar episodes.   In addition, any process leading to a breach in the skin barrier can also predispose to the development of a skin abscesses, such as atopic dermatitis.      ED Prescriptions   None    PDMP not reviewed this encounter.   Teresa Shelba SAUNDERS, NP 08/17/24 [phone]

## 2024-08-17 NOTE — ED Triage Notes (Signed)
 Patient complains of abscess to left elbow area x 1 week. Patient has not taken anything for symptoms. Denies pain at this time.

## 2024-08-17 NOTE — Discharge Instructions (Addendum)
 Wound has been opened and drained here in the clinic  Take cephalexin twice daily for 5 days  Hold warm-hot compresses to affected area at least 4 times a day, this helps to facilitate draining, the more the better  Please return for evaluation for increased swelling, increased tenderness or pain, non healing site, non draining site, you begin to have fever or chills   We reviewed the etiology of recurrent abscesses of skin.  Skin abscesses are collections of pus within the dermis and deeper skin tissues. Skin abscesses manifest as painful, tender, fluctuant, and erythematous nodules, frequently surmounted by a pustule and surrounded by a rim of erythematous swelling.  Spontaneous drainage of purulent material may occur.  Fever can occur on occasion.    -Skin abscesses can develop in healthy individuals with no predisposing conditions other than skin or nasal carriage of Staphylococcus aureus.  Individuals in close contact with others who have active infection with skin abscesses are at increased risk which is likely to explain why twin brother has similar episodes.   In addition, any process leading to a breach in the skin barrier can also predispose to the development of a skin abscesses, such as atopic dermatitis.

## 2024-08-17 NOTE — Telephone Encounter (Signed)
 Med sent to pharmacy.
